# Patient Record
Sex: Male | Born: 1965 | Race: Black or African American | Hispanic: No | Marital: Single
Health system: Midwestern US, Community
[De-identification: ages and names within clinical notes are randomized; demographics above are authoritative.]

## PROBLEM LIST (undated history)

## (undated) DIAGNOSIS — E119 Type 2 diabetes mellitus without complications: Secondary | ICD-10-CM

## (undated) DIAGNOSIS — I509 Heart failure, unspecified: Secondary | ICD-10-CM

---

## 2015-05-13 ENCOUNTER — Inpatient Hospital Stay
Admit: 2015-05-13 | Discharge: 2015-05-13 | Disposition: A | Discharge status: Home / Self Care | Payer: BLUE CROSS/BLUE SHIELD | Attending: Emergency Medicine

## 2015-05-13 ENCOUNTER — Emergency Department
Admit: 2015-05-13 | Payer: BLUE CROSS/BLUE SHIELD | Primary: Student in an Organized Health Care Education/Training Program

## 2015-05-13 DIAGNOSIS — N200 Calculus of kidney: Secondary | ICD-10-CM

## 2015-05-13 LAB — CBC WITH AUTOMATED DIFF
ABS. BASOPHILS: 0 10*3/uL (ref 0.0–0.1)
ABS. EOSINOPHILS: 0.1 10*3/uL (ref 0.0–0.2)
ABS. LYMPHOCYTES: 1.9 10*3/uL (ref 1.0–5.5)
ABS. MONOCYTES: 0.6 10*3/uL (ref 0.1–1.0)
ABS. NEUTROPHILS: 4.4 10*3/uL (ref 2.0–8.1)
BASOPHILS: 0 % (ref 0.0–1.0)
EOSINOPHILS: 1 % (ref 0.0–2.0)
HCT: 45.3 % (ref 42.0–52.0)
HGB: 14.8 g/dL (ref 14.0–18.0)
LYMPHOCYTES: 27 % (ref 20.5–51.1)
MCH: 26.8 PG — ABNORMAL LOW (ref 27.0–31.0)
MCHC: 32.7 g/dL (ref 30.5–36.0)
MCV: 82.1 FL (ref 80.0–96.0)
MONOCYTES: 8 % (ref 1.7–10.0)
MPV: 10.9 FL (ref 10.2–12.7)
NEUTROPHILS: 64 % (ref 42.2–75.2)
PLATELET: 227 10*3/uL (ref 122–400)
RBC: 5.52 M/uL (ref 4.70–6.10)
RDW: 15.3 % — ABNORMAL HIGH (ref 11.4–14.6)
WBC: 7 10*3/uL (ref 4.8–10.8)

## 2015-05-13 LAB — URINALYSIS W/ RFLX MICROSCOPIC
Bilirubin: NEGATIVE
Glucose: NEGATIVE mg/dL
Ketone: 5 mg/dL — AB
Nitrites: POSITIVE — AB
Protein: 300 mg/dL — AB
Specific gravity: 1.01 (ref 1.005–1.030)
Urobilinogen: 1 EU/dL (ref 0.1–1.0)
pH (UA): 5 (ref 4.5–8.0)

## 2015-05-13 LAB — METABOLIC PANEL, COMPREHENSIVE
A-G Ratio: 1 (ref 1.0–1.5)
ALT (SGPT): 53 U/L (ref 12–78)
AST (SGOT): 35 U/L (ref 15–37)
Albumin: 4.3 g/dL (ref 3.4–5.0)
Alk. phosphatase: 50 U/L (ref 46–116)
Anion gap: 9 mmol/L (ref 8–20)
BUN: 23 mg/dL — ABNORMAL HIGH (ref 7–18)
Bilirubin, total: 0.5 mg/dL (ref 0.2–1.0)
CO2: 30 mmol/L (ref 21–32)
Calcium: 9.3 mg/dL (ref 8.5–10.1)
Chloride: 100 mmol/L (ref 98–107)
Creatinine: 2.1 mg/dL — ABNORMAL HIGH (ref 0.70–1.30)
GFR est AA: 43 mL/min/{1.73_m2} — ABNORMAL LOW (ref >60)
GFR est non-AA: 35 mL/min/{1.73_m2} — ABNORMAL LOW (ref >60)
Globulin: 4.1 g/dL (ref 2.5–5.0)
Glucose: 133 mg/dL — ABNORMAL HIGH (ref 74–106)
Potassium: 4.2 mmol/L (ref 3.5–5.1)
Protein, total: 8.4 g/dL — ABNORMAL HIGH (ref 6.4–8.2)
Sodium: 138 mmol/L (ref 136–145)

## 2015-05-13 LAB — URINE MICROSCOPIC

## 2015-05-13 LAB — LIPASE: Lipase: 883 U/L — CR (ref 73–393)

## 2015-05-13 LAB — PROTEIN, CONFIRM

## 2015-05-13 LAB — LACTIC ACID: Lactic acid: 0.8 MMOL/L (ref 0.4–2.0)

## 2015-05-13 MED ORDER — MORPHINE 4 MG/ML SYRINGE
4 mg/mL | INTRAMUSCULAR | Status: DC | PRN
Start: 2015-05-13 — End: 2015-05-13
  Administered 2015-05-13: 14:00:00 via INTRAVENOUS

## 2015-05-13 MED ORDER — SODIUM CHLORIDE 0.9 % IV
INTRAVENOUS | Status: DC
Start: 2015-05-13 — End: 2015-05-13

## 2015-05-13 MED ORDER — SODIUM CHLORIDE 0.9% BOLUS IV
0.9 % | Freq: Once | INTRAVENOUS | Status: AC
Start: 2015-05-13 — End: 2015-05-13
  Administered 2015-05-13: 14:00:00 via INTRAVENOUS

## 2015-05-13 MED ORDER — KETOROLAC TROMETHAMINE 10 MG TAB
10 mg | ORAL_TABLET | Freq: Four times a day (QID) | ORAL | 0 refills | Status: AC | PRN
Start: 2015-05-13 — End: ?

## 2015-05-13 MED ORDER — MORPHINE 4 MG/ML SYRINGE
4 mg/mL | Freq: Once | INTRAMUSCULAR | Status: DC
Start: 2015-05-13 — End: 2015-05-13

## 2015-05-13 MED ORDER — NITROFURANTOIN (25% MACROCRYSTAL FORM) 100 MG CAP
100 mg | ORAL_CAPSULE | Freq: Two times a day (BID) | ORAL | 0 refills | Status: AC
Start: 2015-05-13 — End: 2015-05-20

## 2015-05-13 MED ORDER — METFORMIN 1,000 MG TAB
1000 mg | ORAL_TABLET | Freq: Two times a day (BID) | ORAL | 0 refills | Status: AC
Start: 2015-05-13 — End: ?

## 2015-05-13 MED ORDER — HYDROCODONE-ACETAMINOPHEN 5 MG-325 MG TAB
5-325 mg | ORAL_TABLET | Freq: Four times a day (QID) | ORAL | 0 refills | Status: AC | PRN
Start: 2015-05-13 — End: ?

## 2015-05-13 MED ORDER — ONDANSETRON (PF) 4 MG/2 ML INJECTION
4 mg/2 mL | Freq: Once | INTRAMUSCULAR | Status: DC
Start: 2015-05-13 — End: 2015-05-13

## 2015-05-13 MED ORDER — ONDANSETRON (PF) 4 MG/2 ML INJECTION
4 mg/2 mL | INTRAMUSCULAR | Status: AC
Start: 2015-05-13 — End: 2015-05-13
  Administered 2015-05-13: 14:00:00 via INTRAVENOUS

## 2015-05-13 MED ORDER — TAMSULOSIN SR 0.4 MG 24 HR CAP
0.4 mg | ORAL | Status: AC
Start: 2015-05-13 — End: 2015-05-13
  Administered 2015-05-13: 14:00:00 via ORAL

## 2015-05-13 MED ORDER — KETOROLAC TROMETHAMINE 15 MG/ML INJECTION
15 mg/mL | INTRAMUSCULAR | Status: AC
Start: 2015-05-13 — End: 2015-05-13
  Administered 2015-05-13: 14:00:00 via INTRAVENOUS

## 2015-05-13 MED FILL — TAMSULOSIN SR 0.4 MG 24 HR CAP: 0.4 mg | ORAL | Qty: 1

## 2015-05-13 MED FILL — SODIUM CHLORIDE 0.9 % IV: INTRAVENOUS | Qty: 3000

## 2015-05-13 MED FILL — MORPHINE 4 MG/ML SYRINGE: 4 mg/mL | INTRAMUSCULAR | Qty: 1

## 2015-05-13 MED FILL — ONDANSETRON (PF) 4 MG/2 ML INJECTION: 4 mg/2 mL | INTRAMUSCULAR | Qty: 2

## 2015-05-13 MED FILL — KETOROLAC TROMETHAMINE 15 MG/ML INJECTION: 15 mg/mL | INTRAMUSCULAR | Qty: 1

## 2015-05-13 MED FILL — SODIUM CHLORIDE 0.9 % IV: INTRAVENOUS | Qty: 1000

## 2015-05-13 NOTE — ED Provider Notes (Addendum)
HPI Comments: This pt is bib self and his mother (who is visiting from [place]) and co bloody urine and pain lower abdomen and right side similar to stone that he has had in past. Pt relates he has had cycstoscopy in the past by urologist.        Pt only other med problem per pt is diabetes and some allergies.    [place]     No cp no sob no vomiting     Patient is a 50 y.o. male presenting with flank pain and abdominal pain. The history is provided by the patient.   Flank Pain    This is a new problem. The current episode started 12 to 24 hours ago. The problem has been gradually worsening. The problem occurs constantly. Patient reports not work related injury.The quality of the pain is described as shooting, stabbing and cramping. The pain radiates to the right groin. The pain is moderate. Associated symptoms include abdominal pain and dysuria. Pertinent negatives include no chest pain, no fever and no headaches. He has tried nothing for the symptoms. Risk factors include history of kidney stones.   Abdominal Pain    This is a new problem. The current episode started 12 to 24 hours ago. The problem has been gradually worsening. The pain is mild. Associated symptoms include dysuria, frequency and hematuria. Pertinent negatives include no fever, no diarrhea, no nausea, no vomiting, no constipation, no headaches, no arthralgias, no myalgias, no chest pain and no back pain.        Past Medical History:   Diagnosis Date   ??? Diabetes (HCC)    ??? H/O seasonal allergies        History reviewed. No pertinent surgical history.      History reviewed. No pertinent family history.    Social History     Social History   ??? Marital status: SINGLE     Spouse name: N/A   ??? Number of children: N/A   ??? Years of education: N/A     Occupational History   ??? Not on file.     Social History Main Topics   ??? Smoking status: Never Smoker   ??? Smokeless tobacco: Not on file   ??? Alcohol use No   ??? Drug use: Not on file    ??? Sexual activity: Not on file     Other Topics Concern   ??? Not on file     Social History Narrative   ??? No narrative on file         ALLERGIES: Review of patient's allergies indicates no known allergies.    Review of Systems   Constitutional: Negative for chills and fever.   HENT: Negative for congestion and rhinorrhea.    Eyes: Negative for pain and visual disturbance.   Respiratory: Negative for cough and shortness of breath.    Cardiovascular: Negative for chest pain and palpitations.   Gastrointestinal: Positive for abdominal pain. Negative for blood in stool, constipation, diarrhea, nausea and vomiting.   Genitourinary: Positive for dysuria, flank pain, frequency and hematuria. Negative for difficulty urinating.   Musculoskeletal: Negative for arthralgias, back pain and myalgias.   Skin: Negative for color change and rash.   Neurological: Negative for seizures and headaches.   Psychiatric/Behavioral: Negative for dysphoric mood and self-injury.       Vitals:    05/13/15 0838 05/13/15 0840   BP:  (!) 148/111   Pulse:  96   Resp:  16   Temp:  97.7 ??F (36.5 ??C)   SpO2:  98%   Weight: 97.5 kg (215 lb)    Height:  (1.88 m)             Physical Exam   Constitutional: He is oriented to person, place, and time. He appears well-developed and well-nourished. No distress.   HENT:   Head: Normocephalic and atraumatic.   Eyes: Conjunctivae are normal. Pupils are equal, round, and reactive to light. No scleral icterus.   Neck: Normal range of motion. Neck supple. No tracheal deviation present.   Cardiovascular: Normal rate and regular rhythm.    No murmur heard.  Pulmonary/Chest: Effort normal and breath sounds normal. No respiratory distress. He has no wheezes. He has no rales.   Abdominal: Soft. Bowel sounds are normal. He exhibits no distension. There is tenderness (sl tenderness rlq , no guarding no rebound ). There is no rebound.   Musculoskeletal: Normal range of motion. He exhibits no edema or  tenderness (no cva tenderness ).   Neurological: He is alert and oriented to person, place, and time.   Skin: Skin is warm and dry. No rash noted. He is not diaphoretic.   Psychiatric: He has a normal mood and affect. His behavior is normal.   Nursing note and vitals reviewed.       MDM  ED Course       Procedures    <EMERGENCY DEPARTMENT CASE SUMMARY>    Impression/Differential Diagnosis: rlq pain and hematuria in pt with diabetes on oral agent   ddx most likely uvjxn stone right side, other possibitites cystitis, pyelo , appy less likely , bph, prostatis among others     Plan: Based on my initial history, physical examination, this patient's past history and risk factors, we will obtain diagnostics and based on these and response to therapy will make a decision with best judgement to admit, transfer or discharge this patient with close follow up.  My initial plan is likely to control pain and hydrate and dc with urology fu after labs and ct imaging. Pt may need cystoscopy    ED Course:   9:21 AM  Pt to ct, aawait data    Final Impression/Diagnosis: hematuria, right ureteral colic    Patient condition at time of disposition: stable       I have reviewed the following home medications:    Prior to Admission medications    Medication Sig Start Date End Date Taking? Authorizing Provider   SITagliptin-metFORMIN (JANUMET) 50-1,000 mg per tablet Take 1 Tab by mouth two (2) times daily (with meals).   Yes Phys Other, MD   Cetirizine (ZYRTEC) 10 mg cap Take  by mouth.   Yes Phys Other, MD   fluticasone (FLONASE) 50 mcg/actuation nasal spray 2 Sprays by Both Nostrils route daily.   Yes Phys Other, MD         Arvella Nigh, MD

## 2015-05-13 NOTE — ED Notes (Signed)
Ct to me suggests 4.775mm stone uvjn  Will give flomax , wbc nl await pain relief and all data

## 2015-05-13 NOTE — ED Triage Notes (Signed)
Pt to ed with c/o stomach discomfort since last night and had blood in urine   Pain more rt flank  Had hx of stones

## 2015-05-13 NOTE — ED Notes (Signed)
pcp is dr Johny Chessadubor at [facility]   Pt and mother updated

## 2015-05-13 NOTE — ED Notes (Signed)
Pt disch amb with info sheets and rx electronically sent  instr to strain all urine  Strainer given   Patient is awake, alert, and oriented, speech is clear and patient is able to ambulate (if applicable) and ready for discharge. Verbal and written discharge instructions provided and has the cognitive understanding of discharge instructions. Discharged home with family. All questions answered.

## 2015-05-13 NOTE — ED Notes (Signed)
Called dr Elayne Snarellado, left side per him,   Pt informed small stone

## 2015-05-14 LAB — CULTURE, URINE
Culture result:: NO GROWTH
Culture: NO GROWTH

## 2015-05-18 LAB — CULTURE, BLOOD
Culture result:: NO GROWTH
Culture result:: NO GROWTH

## 2016-01-12 HISTORY — PX: CORONARY ANGIOPLASTY WITH STENT PLACEMENT: SHX49

## 2017-01-02 ENCOUNTER — Inpatient Hospital Stay
Admit: 2017-01-02 | Discharge: 2017-01-02 | Disposition: A | Discharge status: Home / Self Care | Payer: BLUE CROSS/BLUE SHIELD | Attending: Emergency Medical Services

## 2017-01-02 ENCOUNTER — Emergency Department
Admit: 2017-01-02 | Payer: BLUE CROSS/BLUE SHIELD | Primary: Student in an Organized Health Care Education/Training Program

## 2017-01-02 DIAGNOSIS — M7741 Metatarsalgia, right foot: Secondary | ICD-10-CM

## 2017-01-02 MED ORDER — KETOROLAC TROMETHAMINE 30 MG/ML INJECTION
30 mg/mL (1 mL) | INTRAMUSCULAR | Status: AC
Start: 2017-01-02 — End: 2017-01-02
  Administered 2017-01-02: 15:00:00 via INTRAMUSCULAR

## 2017-01-02 MED FILL — KETOROLAC TROMETHAMINE 30 MG/ML INJECTION: 30 mg/mL (1 mL) | INTRAMUSCULAR | Qty: 1

## 2017-01-02 NOTE — ED Triage Notes (Signed)
Pt C/O right foot pain after having cardia cath on 12/30/16 pain is 6/10 at rest

## 2017-01-02 NOTE — ED Provider Notes (Signed)
51 yo male with several days of continuous moderately severe right foot pain unknown cause localized ball of right foot and 4th toe, no history of injury, no joint pain/swelling/redness.  No pallor, coolness, change of color.  Hx of heart cath 2 days ago with right groin without mass, mild appropriately tender.  Was given percocet while while admitted for heart cath, did not help foot pain.  Was told not related to heart cath.  No history of gout, RA.          Foot Pain    This is a new problem. The current episode started more than 2 days ago. The problem occurs daily. The problem has not changed since onset.The pain is present in the left foot. The quality of the pain is described as aching. The pain is moderate. Pertinent negatives include full range of motion, no stiffness and no tingling. The symptoms are aggravated by palpation and standing. He has tried OTC pain medications (opioid analgesics) for the symptoms. The treatment provided no relief. There has been no history of extremity trauma.        Past Medical History:   Diagnosis Date   ??? CAD (coronary artery disease)    ??? Diabetes (HCC)    ??? H/O seasonal allergies        Past Surgical History:   Procedure Laterality Date   ??? HX APPENDECTOMY     ??? HX HEART CATHETERIZATION           History reviewed. No pertinent family history.    Social History     Socioeconomic History   ??? Marital status: SINGLE     Spouse name: Not on file   ??? Number of children: Not on file   ??? Years of education: Not on file   ??? Highest education level: Not on file   Social Needs   ??? Financial resource strain: Not on file   ??? Food insecurity - worry: Not on file   ??? Food insecurity - inability: Not on file   ??? Transportation needs - medical: Not on file   ??? Transportation needs - non-medical: Not on file   Occupational History   ??? Not on file   Tobacco Use   ??? Smoking status: Never Smoker   ??? Smokeless tobacco: Never Used   Substance and Sexual Activity   ??? Alcohol use: No    ??? Drug use: No   ??? Sexual activity: Not on file   Other Topics Concern   ??? Not on file   Social History Narrative   ??? Not on file         ALLERGIES: Shellfish derived    Review of Systems   Musculoskeletal: Negative for joint swelling and stiffness.   Skin: Negative for color change, pallor, rash and wound.   Neurological: Negative for tingling.   All other systems reviewed and are negative.      Vitals:    01/02/17 0950   BP: 121/77   Pulse: 88   Resp: 18   Temp: 98.5 ??F (36.9 ??C)   SpO2: 100%   Weight: 99.3 kg (219 lb)   Height: 6\' 2"  (1.88 m)            Physical Exam   Constitutional: He is oriented to person, place, and time. He appears well-developed and well-nourished. No distress.   HENT:   Head: Normocephalic and atraumatic.   Eyes: Conjunctivae and EOM are normal. Pupils are equal, round, and reactive  to light.   Cardiovascular: Intact distal pulses and normal pulses. Exam reveals no decreased pulses.   Pulses:       Dorsalis pedis pulses are 2+ on the right side, and 2+ on the left side.   Pulmonary/Chest: Effort normal and breath sounds normal.   Abdominal: Soft. He exhibits no distension. There is no tenderness.       Musculoskeletal: Normal range of motion. He exhibits tenderness. He exhibits no edema or deformity.        Right hip: He exhibits no tenderness and no swelling.        Right knee: Normal. He exhibits normal range of motion, no swelling, no effusion, no ecchymosis, no deformity, no laceration, no erythema, normal alignment, no LCL laxity and no bony tenderness. No tenderness found. No medial joint line and no lateral joint line tenderness noted.        Right ankle: Normal. He exhibits normal range of motion, no swelling, no ecchymosis, no deformity, no laceration and normal pulse. No tenderness. No lateral malleolus, no medial malleolus, no posterior TFL, no head of 5th metatarsal and no proximal fibula tenderness found.        Right lower leg: Normal. He exhibits no tenderness, no bony  tenderness, no swelling, no edema, no deformity and no laceration.        Right foot: There is tenderness and bony tenderness. There is normal range of motion, no swelling, normal capillary refill, no crepitus, no deformity and no laceration.        Feet:    Neurological: He is alert and oriented to person, place, and time. No sensory deficit. He exhibits normal muscle tone.   Skin: Skin is warm, dry and intact. Capillary refill takes less than 2 seconds. No abrasion, no bruising, no burn, no ecchymosis, no lesion, no petechiae and no rash noted. He is not diaphoretic. No cyanosis or erythema. No pallor.   Psychiatric: He has a normal mood and affect. His behavior is normal. Thought content normal.   Nursing note and vitals reviewed.       MDM  Number of Diagnoses or Management Options  Metatarsalgia of right foot:   Diagnosis management comments: 51 yo male with several days of continuous moderately severe right foot pain unknown cause localized ball of right foot and 4th toe, no history of injury, no joint pain/swelling/redness.  No pallor, coolness, change of color.  Hx of heart cath 2 days ago with right groin without mass, mild appropriately tender.  Was given percocet while while admitted for heart cath, did not help foot pain.  Was told not related to heart cath.  No history of gout, RA.    Exam with normal appearing right foot, warm, readily palpable dorsalis pedis, skin intact, no erythema/rash, no joint tenderness/warmth.  Tenderness head of metatarsals, no point tenderness dorsum of foot      ED Course:    Xrays right foot  Toradol IM  Podiatry referral          Amount and/or Complexity of Data Reviewed  Tests in the radiology section of CPT??: ordered and reviewed  Independent visualization of images, tracings, or specimens: yes (Independent interpretation of right foot xrays with no acute abnl)    Risk of Complications, Morbidity, and/or Mortality  Presenting problems: high   Diagnostic procedures: moderate  Management options: moderate  General comments: 1.  New, needs workup  2.  xrays ordered/reviewed  Independent interp of xrays  3.  Referral  to orthopedics    Patient Progress  Patient progress: stable         Procedures

## 2017-01-02 NOTE — ED Notes (Signed)
Pt C/O right foot pain after cardiac cath on 12/30/16 pt denies any other symptoms

## 2017-01-02 NOTE — ED Notes (Signed)
Pt to be discharged home A & O X 3 ambulating with a steady gait. D/C instructions reviewed pt verbalizes understanding

## 2019-01-02 ENCOUNTER — Inpatient Hospital Stay (HOSPITAL_BASED_OUTPATIENT_CLINIC_OR_DEPARTMENT_OTHER)
Admission: EM | Admit: 2019-01-02 | Discharge: 2019-01-06 | DRG: 871 | Disposition: A | Discharge status: Home / Self Care | Payer: BLUE CROSS/BLUE SHIELD | Attending: Internal Medicine | Admitting: Internal Medicine

## 2019-01-02 ENCOUNTER — Other Ambulatory Visit: Payer: Self-pay

## 2019-01-02 ENCOUNTER — Emergency Department (HOSPITAL_BASED_OUTPATIENT_CLINIC_OR_DEPARTMENT_OTHER): Payer: BLUE CROSS/BLUE SHIELD

## 2019-01-02 ENCOUNTER — Encounter (HOSPITAL_BASED_OUTPATIENT_CLINIC_OR_DEPARTMENT_OTHER): Payer: Self-pay | Admitting: *Deleted

## 2019-01-02 ENCOUNTER — Inpatient Hospital Stay (HOSPITAL_COMMUNITY): Payer: BLUE CROSS/BLUE SHIELD

## 2019-01-02 DIAGNOSIS — R0602 Shortness of breath: Secondary | ICD-10-CM

## 2019-01-02 DIAGNOSIS — N179 Acute kidney failure, unspecified: Secondary | ICD-10-CM | POA: Diagnosis present

## 2019-01-02 DIAGNOSIS — J9601 Acute respiratory failure with hypoxia: Secondary | ICD-10-CM | POA: Diagnosis present

## 2019-01-02 DIAGNOSIS — E1122 Type 2 diabetes mellitus with diabetic chronic kidney disease: Secondary | ICD-10-CM | POA: Diagnosis present

## 2019-01-02 DIAGNOSIS — Z8249 Family history of ischemic heart disease and other diseases of the circulatory system: Secondary | ICD-10-CM

## 2019-01-02 DIAGNOSIS — I5042 Chronic combined systolic (congestive) and diastolic (congestive) heart failure: Secondary | ICD-10-CM | POA: Diagnosis present

## 2019-01-02 DIAGNOSIS — Z7982 Long term (current) use of aspirin: Secondary | ICD-10-CM | POA: Diagnosis not present

## 2019-01-02 DIAGNOSIS — I959 Hypotension, unspecified: Secondary | ICD-10-CM | POA: Diagnosis present

## 2019-01-02 DIAGNOSIS — J1289 Other viral pneumonia: Secondary | ICD-10-CM | POA: Diagnosis present

## 2019-01-02 DIAGNOSIS — Z7902 Long term (current) use of antithrombotics/antiplatelets: Secondary | ICD-10-CM | POA: Diagnosis not present

## 2019-01-02 DIAGNOSIS — A4189 Other specified sepsis: Principal | ICD-10-CM | POA: Diagnosis present

## 2019-01-02 DIAGNOSIS — U071 COVID-19: Secondary | ICD-10-CM | POA: Diagnosis present

## 2019-01-02 DIAGNOSIS — Z7984 Long term (current) use of oral hypoglycemic drugs: Secondary | ICD-10-CM

## 2019-01-02 DIAGNOSIS — I2510 Atherosclerotic heart disease of native coronary artery without angina pectoris: Secondary | ICD-10-CM | POA: Diagnosis present

## 2019-01-02 DIAGNOSIS — Z79899 Other long term (current) drug therapy: Secondary | ICD-10-CM | POA: Diagnosis not present

## 2019-01-02 DIAGNOSIS — N189 Chronic kidney disease, unspecified: Secondary | ICD-10-CM | POA: Diagnosis present

## 2019-01-02 DIAGNOSIS — E0800 Diabetes mellitus due to underlying condition with hyperosmolarity without nonketotic hyperglycemic-hyperosmolar coma (NKHHC): Secondary | ICD-10-CM

## 2019-01-02 DIAGNOSIS — Z955 Presence of coronary angioplasty implant and graft: Secondary | ICD-10-CM | POA: Diagnosis not present

## 2019-01-02 DIAGNOSIS — R000 Tachycardia, unspecified: Secondary | ICD-10-CM

## 2019-01-02 DIAGNOSIS — R7989 Other specified abnormal findings of blood chemistry: Secondary | ICD-10-CM

## 2019-01-02 DIAGNOSIS — I5022 Chronic systolic (congestive) heart failure: Secondary | ICD-10-CM | POA: Diagnosis present

## 2019-01-02 DIAGNOSIS — E119 Type 2 diabetes mellitus without complications: Secondary | ICD-10-CM

## 2019-01-02 HISTORY — DX: Type 2 diabetes mellitus without complications: E11.9

## 2019-01-02 HISTORY — DX: Heart failure, unspecified: I50.9

## 2019-01-02 LAB — CBG MONITORING, ED: Glucose-Capillary: 103 mg/dL — ABNORMAL HIGH (ref 70–99)

## 2019-01-02 LAB — URINALYSIS, MICROSCOPIC (REFLEX)

## 2019-01-02 LAB — SARS CORONAVIRUS 2 (TAT 6-24 HRS): SARS Coronavirus 2: POSITIVE — AB

## 2019-01-02 LAB — CBC WITH DIFFERENTIAL/PLATELET
Abs Immature Granulocytes: 0.06 10*3/uL (ref 0.00–0.07)
Basophils Absolute: 0 10*3/uL (ref 0.0–0.1)
Basophils Relative: 0 %
Eosinophils Absolute: 0 10*3/uL (ref 0.0–0.5)
Eosinophils Relative: 0 %
HCT: 49.1 % (ref 39.0–52.0)
Hemoglobin: 15.9 g/dL (ref 13.0–17.0)
Immature Granulocytes: 1 %
Lymphocytes Relative: 14 %
Lymphs Abs: 1.4 10*3/uL (ref 0.7–4.0)
MCH: 28.2 pg (ref 26.0–34.0)
MCHC: 32.4 g/dL (ref 30.0–36.0)
MCV: 87.1 fL (ref 80.0–100.0)
Monocytes Absolute: 0.6 10*3/uL (ref 0.1–1.0)
Monocytes Relative: 6 %
Neutro Abs: 7.7 10*3/uL (ref 1.7–7.7)
Neutrophils Relative %: 79 %
Platelets: 241 10*3/uL (ref 150–400)
RBC: 5.64 MIL/uL (ref 4.22–5.81)
RDW: 14.9 % (ref 11.5–15.5)
WBC: 9.8 10*3/uL (ref 4.0–10.5)
nRBC: 0 % (ref 0.0–0.2)

## 2019-01-02 LAB — URINALYSIS, ROUTINE W REFLEX MICROSCOPIC
Bilirubin Urine: NEGATIVE
Glucose, UA: >=500 mg/dL — AB
Ketones, ur: NEGATIVE mg/dL
Leukocytes,Ua: NEGATIVE
Nitrite: NEGATIVE
Protein, ur: NEGATIVE mg/dL
Specific Gravity, Urine: 1.02 (ref 1.005–1.030)
pH: 6 (ref 5.0–8.0)

## 2019-01-02 LAB — D-DIMER, QUANTITATIVE: D-Dimer, Quant: 0.75 ug/mL-FEU — ABNORMAL HIGH (ref 0.00–0.50)

## 2019-01-02 LAB — PROCALCITONIN: Procalcitonin: <0.1 ng/mL

## 2019-01-02 LAB — TROPONIN I (HIGH SENSITIVITY): Troponin I (High Sensitivity): 8 ng/L (ref <18)

## 2019-01-02 LAB — LACTIC ACID, PLASMA: Lactic Acid, Venous: 1.7 mmol/L (ref 0.5–1.9)

## 2019-01-02 LAB — COMPREHENSIVE METABOLIC PANEL
ALT: 26 U/L (ref 0–44)
AST: 23 U/L (ref 15–41)
Albumin: 4.1 g/dL (ref 3.5–5.0)
Alkaline Phosphatase: 58 U/L (ref 38–126)
Anion gap: 12 (ref 5–15)
BUN: 40 mg/dL — ABNORMAL HIGH (ref 6–20)
CO2: 27 mmol/L (ref 22–32)
Calcium: 9.4 mg/dL (ref 8.9–10.3)
Chloride: 97 mmol/L — ABNORMAL LOW (ref 98–111)
Creatinine, Ser: 2.28 mg/dL — ABNORMAL HIGH (ref 0.61–1.24)
GFR calc Af Amer: 36 mL/min — ABNORMAL LOW (ref >60)
GFR calc non Af Amer: 31 mL/min — ABNORMAL LOW (ref >60)
Glucose, Bld: 103 mg/dL — ABNORMAL HIGH (ref 70–99)
Potassium: 3.7 mmol/L (ref 3.5–5.1)
Sodium: 136 mmol/L (ref 135–145)
Total Bilirubin: 0.8 mg/dL (ref 0.3–1.2)
Total Protein: 8.2 g/dL — ABNORMAL HIGH (ref 6.5–8.1)

## 2019-01-02 LAB — LACTATE DEHYDROGENASE: LDH: 144 U/L (ref 98–192)

## 2019-01-02 LAB — TRIGLYCERIDES: Triglycerides: 308 mg/dL — ABNORMAL HIGH (ref <150)

## 2019-01-02 LAB — GLUCOSE, CAPILLARY: Glucose-Capillary: 68 mg/dL — ABNORMAL LOW (ref 70–99)

## 2019-01-02 LAB — BRAIN NATRIURETIC PEPTIDE: B Natriuretic Peptide: 18.6 pg/mL (ref 0.0–100.0)

## 2019-01-02 LAB — FERRITIN: Ferritin: 281 ng/mL (ref 24–336)

## 2019-01-02 LAB — FIBRINOGEN: Fibrinogen: 689 mg/dL — ABNORMAL HIGH (ref 210–475)

## 2019-01-02 LAB — C-REACTIVE PROTEIN: CRP: 3.7 mg/dL — ABNORMAL HIGH (ref <1.0)

## 2019-01-02 LAB — SARS CORONAVIRUS 2 AG (30 MIN TAT): SARS Coronavirus 2 Ag: POSITIVE — AB

## 2019-01-02 MED ORDER — METOPROLOL TARTRATE 25 MG PO TABS
25.0000 mg | ORAL_TABLET | Freq: Two times a day (BID) | ORAL | Status: DC
  Administered 2019-01-03 – 2019-01-06 (×8): 25 mg via ORAL
  Filled 2019-01-02 (×8): qty 1

## 2019-01-02 MED ORDER — INSULIN ASPART 100 UNIT/ML ~~LOC~~ SOLN
0.0000 [IU] | Freq: Three times a day (TID) | SUBCUTANEOUS | Status: DC
  Administered 2019-01-03: 2 [IU] via SUBCUTANEOUS
  Administered 2019-01-03 – 2019-01-05 (×5): 1 [IU] via SUBCUTANEOUS
  Administered 2019-01-06 (×2): 2 [IU] via SUBCUTANEOUS

## 2019-01-02 MED ORDER — ENOXAPARIN SODIUM 40 MG/0.4ML ~~LOC~~ SOLN
40.0000 mg | Freq: Every day | SUBCUTANEOUS | Status: DC
  Administered 2019-01-02 – 2019-01-05 (×4): 40 mg via SUBCUTANEOUS
  Filled 2019-01-02 (×4): qty 0.4

## 2019-01-02 MED ORDER — INSULIN ASPART 100 UNIT/ML ~~LOC~~ SOLN: 0.0000 [IU] | Freq: Every day | SUBCUTANEOUS | Status: DC

## 2019-01-02 MED ORDER — ACETAMINOPHEN 500 MG PO TABS
1000.0000 mg | ORAL_TABLET | Freq: Once | ORAL | Status: AC
  Administered 2019-01-02: 13:00:00 1000 mg via ORAL
  Filled 2019-01-02: qty 2

## 2019-01-02 MED ORDER — ACETAMINOPHEN 325 MG PO TABS
650.0000 mg | ORAL_TABLET | Freq: Four times a day (QID) | ORAL | Status: DC | PRN
  Administered 2019-01-02: 650 mg via ORAL
  Filled 2019-01-02: qty 2

## 2019-01-02 MED ORDER — DEXAMETHASONE SODIUM PHOSPHATE 10 MG/ML IJ SOLN
6.0000 mg | Freq: Every day | INTRAMUSCULAR | Status: DC
  Administered 2019-01-02 – 2019-01-05 (×4): 6 mg via INTRAVENOUS
  Filled 2019-01-02 (×4): qty 1

## 2019-01-02 MED ORDER — METFORMIN HCL 500 MG PO TABS
500.0000 mg | ORAL_TABLET | Freq: Two times a day (BID) | ORAL | Status: DC
  Administered 2019-01-02: 500 mg via ORAL
  Filled 2019-01-02: qty 1

## 2019-01-02 MED ORDER — ASPIRIN EC 81 MG PO TBEC
81.0000 mg | DELAYED_RELEASE_TABLET | Freq: Every day | ORAL | Status: DC
  Administered 2019-01-03 – 2019-01-06 (×4): 81 mg via ORAL
  Filled 2019-01-02 (×4): qty 1

## 2019-01-02 MED ORDER — SODIUM CHLORIDE 0.9 % IV SOLN
100.0000 mg | Freq: Every day | INTRAVENOUS | Status: AC
  Administered 2019-01-03 – 2019-01-06 (×4): 100 mg via INTRAVENOUS
  Filled 2019-01-02 (×4): qty 100

## 2019-01-02 MED ORDER — SODIUM CHLORIDE 0.9 % IV SOLN
Freq: Once | INTRAVENOUS | Status: AC
  Administered 2019-01-02: 1000 mL via INTRAVENOUS

## 2019-01-02 MED ORDER — SODIUM CHLORIDE 0.9 % IV BOLUS
500.0000 mL | Freq: Once | INTRAVENOUS | Status: AC
  Administered 2019-01-02: 500 mL via INTRAVENOUS

## 2019-01-02 MED ORDER — SODIUM CHLORIDE 0.9 % IV SOLN: INTRAVENOUS | Status: DC

## 2019-01-02 MED ORDER — CLOPIDOGREL BISULFATE 75 MG PO TABS
75.0000 mg | ORAL_TABLET | Freq: Every day | ORAL | Status: DC
  Administered 2019-01-02 – 2019-01-06 (×5): 75 mg via ORAL
  Filled 2019-01-02 (×5): qty 1

## 2019-01-02 MED ORDER — SODIUM CHLORIDE 0.9 % IV SOLN
200.0000 mg | Freq: Once | INTRAVENOUS | Status: AC
  Administered 2019-01-02: 200 mg via INTRAVENOUS
  Filled 2019-01-02: qty 200

## 2019-01-02 MED ORDER — DAPAGLIFLOZIN PROPANEDIOL 5 MG PO TABS: 5.0000 mg | ORAL_TABLET | Freq: Every day | ORAL | Status: DC

## 2019-01-02 NOTE — H&P (Addendum)
TRH H&P    Patient Demographics:    Oscar Mack, is a 53 y.o. male  MRN: [id number]  DOB - January 09, 1966  Admit Date - 01/02/2019  Referring MD/NP/PA:  Arlean Hopping  Outpatient Primary MD for the patient is Patient, No Pcp Per  Patient coming from:  Home to [place]  Chief complaint- dyspnea   HPI:    Oscar Mack  is a 53 y.o. male, visiting from [place], w Dm2, CAD s/p stent x2, CHF (systolic), apparently [id number] positive on 12/25/2018 ? presents with dyspnea , tachycardia, and nasal congestion, cough, diarrhea, intermittent headache and fever 102 and myalgia.    In ED,  T 99.5, P 121, R 16, Bp 115/86  Pox 98% on RA Wt 97.9kg  CXR IMPRESSION: No active disease.  Na 136, K 3.7, Bun 40, Creatinine 2.28 Ast 23, Alt 26 Wbc 9.8, Hgb 15.9, Plt 241 Urinalysis negative Lactic acid 1.7 Procalcitonin <0.1 LDH 144 Ferritin 281 Tg 308 Fibrinogen 689 Crp 3.7 BNP 18.6  D dimer 0.75  Sars Coronavirus 2 positive   Blood culture x2 pending    Pt was given remdesivir, and ns 522mL iv x1 in ED,   Pt will be admitted for covid-19 and ? ARF.    Review of systems:    In addition to the HPI above,    No Headache, No changes with Vision or hearing, No problems swallowing food or Liquids, No Chest pain,  No Abdominal pain,    No Blood in stool or Urine, No dysuria, No new skin rashes or bruises, No new joints pains-aches,  No new weakness, tingling, numbness in any extremity, No recent weight gain or loss, No polyuria, polydypsia or polyphagia, No significant Mental Stressors.  All other systems reviewed and are negative.    Past History of the following :    Past Medical History:  Diagnosis Date  . Diabetes mellitus without complication ([place])   . Heart failure [facility])       Past Surgical History:  Procedure Laterality Date  . CORONARY ANGIOPLASTY WITH STENT PLACEMENT  2018      Social History:      Social History   Tobacco Use  . Smoking status: Never Smoker  . Smokeless tobacco: Never Used  Substance Use Topics  . Alcohol use: Never       Family History :     Family History  Problem Relation Age of Onset  . Heart attack Father        Home Medications:   Prior to Admission medications   Medication Sig Start Date End Date Taking? Authorizing Provider  aspirin EC 81 MG tablet Take 81 mg by mouth daily.   Yes [provider]  clopidogrel (PLAVIX) 75 MG tablet Take 75 mg by mouth daily.   Yes [provider]  Dapagliflozin Propanediol (FARXIGA PO) Take by mouth.   Yes [provider]  metFORMIN (GLUCOPHAGE) 500 MG tablet Take by mouth 2 (two) times daily with a meal.   Yes [provider]  metoprolol tartrate (LOPRESSOR) 25 MG tablet Take 25 mg by mouth 2 (two) times daily.   Yes [provider]  Sacubitril-Valsartan (ENTRESTO PO) Take by mouth 2 (two) times daily.   Yes [provider]  Semaglutide (OZEMPIC, 1 MG/DOSE, [place]) Inject 1 mg into the skin once a week.   Yes [provider]     Allergies:    No Known Allergies   Physical Exam:   Vitals  Blood pressure 105/72, pulse (!) 102, temperature 98.6 F (37 C), temperature source Oral, resp. rate 18, height  (1.88 m), weight 96.8 kg, SpO2 97 %.  1.  General: axoxo3  2. Psychiatric: euthymic  3. Neurologic: cn2-12 intact, reflexes 2+ symmetric, diffuse with no clonus, motor 5/5 in all 4 ext  4. HEENMT:  Anicteric, pupils 1.25mm symmetric, direct, consensual , near intact,  Neck: no jvd  5. Respiratory : Slight crackles bilateral base, no wheezing  6. Cardiovascular : rrr s1, s2, no m/g/r  7. Gastrointestinal:  Abd: soft, nt, nd, +bs  8. Skin:  Ext: no c/c/e, no rash  9.Musculoskeletal:  Good ROM    Data Review:    CBC Recent Labs  Lab 01/02/19 0957  WBC 9.8  HGB 15.9  HCT 49.1  PLT 241  MCV  87.1  MCH 28.2  MCHC 32.4  RDW 14.9  LYMPHSABS 1.4  MONOABS 0.6  EOSABS 0.0  BASOSABS 0.0   ------------------------------------------------------------------------------------------------------------------  Results for orders placed or performed during the hospital encounter of 01/02/19 (from the past 48 hour(s))  Comprehensive metabolic panel     Status: Abnormal   Collection Time: 01/02/19  9:57 AM  Result Value Ref Range   Sodium 136 135 - 145 mmol/L   Potassium 3.7 3.5 - 5.1 mmol/L   Chloride 97 (L) 98 - 111 mmol/L   CO2 27 22 - 32 mmol/L   Glucose, Bld 103 (H) 70 - 99 mg/dL   BUN 40 (H) 6 - 20 mg/dL   Creatinine, Ser 8.11 (H) 0.61 - 1.24 mg/dL   Calcium 9.4 8.9 - 91.4 mg/dL   Total Protein 8.2 (H) 6.5 - 8.1 g/dL   Albumin 4.1 3.5 - 5.0 g/dL   AST 23 15 - 41 U/L   ALT 26 0 - 44 U/L   Alkaline Phosphatase 58 38 - 126 U/L   Total Bilirubin 0.8 0.3 - 1.2 mg/dL   GFR calc non Af Amer 31 (L) >60 mL/min   GFR calc Af Amer 36 (L) >60 mL/min   Anion gap 12 5 - 15    Comment: Performed at [facility], [address] [place], [place] [address]  CBC with Differential     Status: None   Collection Time: 01/02/19  9:57 AM  Result Value Ref Range   WBC 9.8 4.0 - 10.5 K/uL   RBC 5.64 4.22 - 5.81 MIL/uL   Hemoglobin 15.9 13.0 - 17.0 g/dL   HCT 62.1 30.8 - 65.7 %   MCV 87.1 80.0 - 100.0 fL   MCH 28.2 26.0 - 34.0 pg   MCHC 32.4 30.0 - 36.0 g/dL   RDW 84.6 96.2 - 95.2 %   Platelets 241 150 - 400 K/uL   nRBC 0.0 0.0 - 0.2 %   Neutrophils Relative % 79 %   Neutro Abs 7.7 1.7 - 7.7 K/uL   Lymphocytes Relative 14 %   Lymphs Abs 1.4 0.7 - 4.0 K/uL   Monocytes Relative 6 %   Monocytes  Absolute 0.6 0.1 - 1.0 K/uL   Eosinophils Relative 0 %   Eosinophils Absolute 0.0 0.0 - 0.5 K/uL   Basophils Relative 0 %   Basophils Absolute 0.0 0.0 - 0.1 K/uL   Immature Granulocytes 1 %   Abs Immature Granulocytes 0.06 0.00 - 0.07 K/uL    Comment: Performed at [facility] [place], [address] [place], [place] [address]  Urinalysis, Routine w reflex microscopic     Status: Abnormal   Collection Time: 01/02/19  9:57 AM  Result Value Ref Range   Color, Urine YELLOW YELLOW   APPearance CLEAR CLEAR   Specific Gravity, Urine 1.020 1.005 - 1.030   pH 6.0 5.0 - 8.0   Glucose, UA >=500 (A) NEGATIVE mg/dL   Hgb urine dipstick TRACE (A) NEGATIVE   Bilirubin Urine NEGATIVE NEGATIVE   Ketones, ur NEGATIVE NEGATIVE mg/dL   Protein, ur NEGATIVE NEGATIVE mg/dL   Nitrite NEGATIVE NEGATIVE   Leukocytes,Ua NEGATIVE NEGATIVE    Comment: Performed at [facility] [place], [address] [place], [place] [address]  Urinalysis, Microscopic (reflex)     Status: Abnormal   Collection Time: 01/02/19  9:57 AM  Result Value Ref Range   RBC / HPF 6-10 0 - 5 RBC/hpf   WBC, UA 0-5 0 - 5 WBC/hpf   Bacteria, UA FEW (A) NONE SEEN   Squamous Epithelial / LPF 0-5 0 - 5   Mucus PRESENT    Hyaline Casts, UA PRESENT    Granular Casts, UA PRESENT    WBC Casts, UA PRESENT     Comment: Performed at [facility] [place], [address] [place], [place] [address]  Lactic acid, plasma     Status: None   Collection Time: 01/02/19 12:37 PM  Result Value Ref Range   Lactic Acid, Venous 1.7 0.5 - 1.9 mmol/L    Comment: Performed at [facility] [place], [address] [place], [place] [address]  D-dimer, quantitative     Status: Abnormal   Collection Time: 01/02/19 12:37 PM  Result Value Ref Range   D-Dimer, Quant 0.75 (H) 0.00 - 0.50 ug/mL-FEU    Comment: (NOTE) At the manufacturer cut-off of 0.50 ug/mL FEU, this assay has been documented to exclude PE with a sensitivity and negative predictive value of 97 to 99%.  At this time, this assay has not been approved by the FDA to exclude DVT/VTE. Results should be correlated with clinical presentation. Performed at [facility] [place], [address] [place], [place] [address]   Procalcitonin     Status: None    Collection Time: 01/02/19 12:37 PM  Result Value Ref Range   Procalcitonin <0.10 ng/mL    Comment:        Interpretation: PCT (Procalcitonin) <= 0.5 ng/mL: Systemic infection (sepsis) is not likely. Local bacterial infection is possible. (NOTE)       Sepsis PCT Algorithm           Lower Respiratory Tract                                      Infection PCT Algorithm    ----------------------------     ----------------------------         PCT < 0.25 ng/mL                PCT < 0.10 ng/mL  Strongly encourage             Strongly discourage   discontinuation of antibiotics    initiation of antibiotics    ----------------------------     -----------------------------       PCT 0.25 - 0.50 ng/mL            PCT 0.10 - 0.25 ng/mL               OR       >80% decrease in PCT            Discourage initiation of                                            antibiotics      Encourage discontinuation           of antibiotics    ----------------------------     -----------------------------         PCT >= 0.50 ng/mL              PCT 0.26 - 0.50 ng/mL               AND        <80% decrease in PCT             Encourage initiation of                                             antibiotics       Encourage continuation           of antibiotics    ----------------------------     -----------------------------        PCT >= 0.50 ng/mL                  PCT > 0.50 ng/mL               AND         increase in PCT                  Strongly encourage                                      initiation of antibiotics    Strongly encourage escalation           of antibiotics                                     -----------------------------                                           PCT <= 0.25 ng/mL                                                 OR                                        >   80% decrease in PCT                                     Discontinue / Do not initiate                                              antibiotics Performed at [facility], [address], [place], [place] [address]   Lactate dehydrogenase     Status: None   Collection Time: 01/02/19 12:37 PM  Result Value Ref Range   LDH 144 98 - 192 U/L    Comment: Performed at [facility], [address], [place], [place] [address]  Ferritin     Status: None   Collection Time: 01/02/19 12:37 PM  Result Value Ref Range   Ferritin 281 24 - 336 ng/mL    Comment: Performed at [facility], [address] [place], [place] [address]  Triglycerides     Status: Abnormal   Collection Time: 01/02/19 12:37 PM  Result Value Ref Range   Triglycerides 308 (H) <150 mg/dL    Comment: Performed at [facility], [address] [place], [place] [address]  Fibrinogen     Status: Abnormal   Collection Time: 01/02/19 12:37 PM  Result Value Ref Range   Fibrinogen 689 (H) 210 - 475 mg/dL    Comment: Performed at [facility], [address], [place], [place] [address]  C-reactive protein     Status: Abnormal   Collection Time: 01/02/19 12:37 PM  Result Value Ref Range   CRP 3.7 (H) <1.0 mg/dL    Comment: Performed at [facility], [address], [place], [place] [address]  Brain natriuretic peptide     Status: None   Collection Time: 01/02/19 12:37 PM  Result Value Ref Range   B Natriuretic Peptide 18.6 0.0 - 100.0 pg/mL    Comment: Performed at [facility], [address] [place], [place] [address]  SARS Coronavirus 2 Ag (30 min TAT) - Nasopharyngeal Swab     Status: Abnormal   Collection Time: 01/02/19 12:37 PM   Specimen: Nasopharyngeal Swab; Nasal Swab  Result Value Ref Range   SARS Coronavirus 2 Ag POSITIVE (A) NEGATIVE    Comment: RESULT CALLED TO, READ BACK BY AND VERIFIED WITH: MARVA SIMMS RN @1341  01/02/2019 OLSONM (NOTE) SARS-CoV-2 antigen PRESENT. Positive results indicate the presence of viral antigens, but clinical correlation with patient history and other  diagnostic information is necessary to determine patient infection status.  Positive results do not rule out bacterial infection or co-infection  with other viruses. False positive results are rare but can occur, and confirmatory RT-PCR testing may be appropriate in some circumstances. The expected result is Negative. Fact Sheet for Patients: 01/04/2019 Fact Sheet for Providers: https://sanders-williams.net/  This test is not yet approved or cleared by the https://martinez.com/ FDA and  has been authorized for detection and/or diagnosis of SARS-CoV-2 by FDA under an Emergency Use Authorization (EUA).  This EUA will remain in effect (meaning this test can be used) for the duration of  the COVID-19 decl aration under Section 564(b)(1) of the Act, 21 U.S.C. section 360bbb-3(b)(1), unless the authorization is terminated or revoked sooner. Performed at [facility]  [place], [address] [place], [place] [address]   SARS CORONAVIRUS 2 (TAT 6-24 HRS) Nasopharyngeal Nasopharyngeal Swab     Status: Abnormal   Collection Time: 01/02/19  1:04 PM   Specimen: Nasopharyngeal Swab  Result Value Ref Range   SARS Coronavirus 2 POSITIVE (A) NEGATIVE    Comment: RESULT CALLED TO, READ BACK BY AND VERIFIED WITH: B.PAUDEL,RN [id number] I.MANNING Performed at [facility], [address], [place], [place] [address]   CBG monitoring, ED     Status: Abnormal   Collection Time: 01/02/19  3:21 PM  Result Value Ref Range   Glucose-Capillary 103 (H) 70 - 99 mg/dL    Chemistries  Recent Labs  Lab 01/02/19 0957  NA 136  K 3.7  CL 97*  CO2 27  GLUCOSE 103*  BUN 40*  CREATININE 2.28*  CALCIUM 9.4  AST 23  ALT 26  ALKPHOS 58  BILITOT 0.8    ------------------------------------------------------------------------------------------------------------------  ------------------------------------------------------------------------------------------------------------------ GFR: Estimated Creatinine Clearance: 42.6 mL/min (A) (by C-G formula based on SCr of 2.28 mg/dL (H)). Liver Function Tests: Recent Labs  Lab 01/02/19 0957  AST 23  ALT 26  ALKPHOS 58  BILITOT 0.8  PROT 8.2*  ALBUMIN 4.1   No results for input(s): LIPASE, AMYLASE in the last 168 hours. No results for input(s): AMMONIA in the last 168 hours. Coagulation Profile: No results for input(s): INR, PROTIME in the last 168 hours. Cardiac Enzymes: No results for input(s): CKTOTAL, CKMB, CKMBINDEX, TROPONINI in the last 168 hours. BNP (last 3 results) No results for input(s): PROBNP in the last 8760 hours. HbA1C: No results for input(s): HGBA1C in the last 72 hours. CBG: Recent Labs  Lab 01/02/19 1521  GLUCAP 103*   Lipid Profile: Recent Labs    01/02/19 1237  TRIG 308*   Thyroid Function Tests: No results for input(s): TSH, T4TOTAL, FREET4, T3FREE, THYROIDAB in the last 72 hours. Anemia Panel: Recent Labs    01/02/19 1237  FERRITIN 281    --------------------------------------------------------------------------------------------------------------- Urine analysis:    Component Value Date/Time   COLORURINE YELLOW 01/02/2019 0957   APPEARANCEUR CLEAR 01/02/2019 0957   LABSPEC 1.020 01/02/2019 0957   PHURINE 6.0 01/02/2019 0957   GLUCOSEU >=500 (A) 01/02/2019 0957   HGBUR TRACE (A) 01/02/2019 0957   BILIRUBINUR NEGATIVE 01/02/2019 0957   KETONESUR NEGATIVE 01/02/2019 0957   PROTEINUR NEGATIVE 01/02/2019 0957   NITRITE NEGATIVE 01/02/2019 0957   LEUKOCYTESUR NEGATIVE 01/02/2019 0957      Imaging Results:    DG Chest Portable 1 View  Result Date: 01/02/2019 CLINICAL DATA:  Dyspnea, cough, headache EXAM: PORTABLE CHEST 1 VIEW  COMPARISON:  None. FINDINGS: Normal heart size. Normal mediastinal contour. No pneumothorax. No pleural effusion. Lungs appear clear, with no acute consolidative airspace disease and no pulmonary edema. IMPRESSION: No active disease. Electronically Signed   By: Delbert Phenix M.D.   On: 01/02/2019 09:12   ST at 130, nl axis, no st-t changes c/w ischemia    Assessment & Plan:    Active Problems:   COVID-19   COVID-19 virus infection  Covid-19 infection Dexamethasone  iv qday Remdesivir pharmacy consult Monitor  Sinus Tachycardia Tele Trop I q2h x2 Check tsh Check cardiac echo  + D dimer Check VQ scan r/o PE  ARF STOP Metformin Renal ultrasound Hydrate with ns in ED Check cmp in am  Dm2 STOP Metformin as above Hold Ozempic (due to diarrhea) Cont Farxiga  po qday fsbs ac and at  bedtime, ISS  H/o CHF (systolic), CAD Cont Aspirin Cont Plavix  po qday Cont Metoprolol  po bid Hold Entresto 1 po bid due to hypotension   DVT Prophylaxis-   Lovenox - SCDs   AM Labs Ordered, also please review Full Orders  Family Communication: Admission, patients condition and plan of care including tests being ordered have been discussed with the patient  who indicate understanding and agree with the plan and Code Status.  Code Status:  FULL CODE per patient  Admission status:    Inpatient: Based on patients clinical presentation and evaluation of above clinical data, I have made determination that patient meets Inpatient criteria at this time.   Pt will require iv steroids and iv remdesivir,  Consider [place].  Pt has high risk of clniical deterioration, pt will require > 2 nites stay  Time spent in minutes :  70  minutes   Pearson Grippe M.D on 01/02/2019 at 9:36 PM

## 2019-01-02 NOTE — Progress Notes (Signed)
Pt tx from [facility] for COVID. Remdesivir ordered. ALT wnl.  Remdesivir 200mg  IV x1 then 100mg  IV q24 x4 Monitor ALT  Onnie Boer, PharmD, [place], AAHIVP, CPP Infectious Disease Pharmacist 01/02/2019 9:31 PM

## 2019-01-02 NOTE — ED Notes (Signed)
Spoke with Talbert Forest, pt's brother (with pt's approval), and updated him of patient's current status.  Informed him that at this moment, we are waiting for lab results.

## 2019-01-02 NOTE — ED Triage Notes (Signed)
Chills, body aches, shortness of breath for 3-4 days and an excruciating headache.

## 2019-01-02 NOTE — ED Notes (Signed)
Pt's brother is informed that patient will be admitted and I will give him a call once I have the room assignment and the ambulance is here.

## 2019-01-02 NOTE — ED Notes (Signed)
Pt drinking water and a lean cuisine dinner being made and CBG checked. And RN Elba Barman informed.

## 2019-01-02 NOTE — Progress Notes (Signed)
Called for admission from [facility] patient Oscar Mack date of birth 1965/04/04  Patient is a 53 year old overweight male with a past medical history significant for diabetes mellitus and CAD status post stenting who presents with chief complaint of increased work of breathing, shortness of breath and tachycardia with associated fatigue and weakness.  Of note he states he tested + December 14 for Covid risk from [place].  Has had fevers and in the ED he had a temperature of 100.2.  He continues to be tachycardic and tachypneic.  Chest x-ray appears to be clear.  Inflammatory markers are still pending.  Continues to have sinus tachycardia and looks like a worsening AKI on possible CKD.  EDP was concerned about possible PE but unfortunately CTA cannot be done due to patient's renal function.  [place] does not have a VQ scan.  Blood cultures are pending and there is no clear source of infection currently but likely from COVID-19 disease.  Patient has a pending Covid test in our system and unfortunately unable to receive the Covid test that he had in [place].  Accepted to telemetry as he likely has Covid disease causing these symptoms.  Of note he is also had increased rhinorrhea and nasal congestion followed by chills, nonproductive cough, diarrhea and intermittent headaches with a fever of 102.  Likely will need steroids and Covid treatment but did not asked the EDP to give remdesivir as there is no evidence of pneumonia or hypoxia.  Accepted to telemetry observation for now.

## 2019-01-02 NOTE — ED Notes (Signed)
Report given to [facility]. ETA of 30 minutes until arrival of [facility].

## 2019-01-02 NOTE — ED Provider Notes (Signed)
[facility] EMERGENCY DEPARTMENT Provider Note   CSN: [id number] Arrival date & time: 01/02/19  9147     History Chief Complaint  Patient presents with  . Shortness of Breath    Oscar Mack is a 53 y.o. male.  HPI      Oscar Mack is a 53 y.o. male, with a history of DM and coronary stent, presenting to the ED with shortness of breath that began last night.  States he started having symptoms of rhinorrhea and nasal congestion around Dec 12. Tested positive for COVID Dec 14. Followed by chills, nonproductive cough, diarrhea, intermittent headaches, and body aches.  Fever noted three days ago, 102F.   Last dose of all medications was last night.  He is from [place] and has been in the area for about a month caring for his COVID positive mother.   Denies chest pain, syncope, abdominal pain, N/V, lower extremity edema/pain, or any other complaints.    Past Medical History:  Diagnosis Date  . Diabetes mellitus without complication (HCC)   . Heart failure [facility])     Patient Active Problem List   Diagnosis Date Noted  . COVID-19 01/02/2019    Past Surgical History:  Procedure Laterality Date  . CORONARY ANGIOPLASTY WITH STENT PLACEMENT  2018       Family History  Problem Relation Age of Onset  . Heart attack Father     Social History   Tobacco Use  . Smoking status: Never Smoker  . Smokeless tobacco: Never Used  Substance Use Topics  . Alcohol use: Never  . Drug use: Never    Home Medications Prior to Admission medications   Medication Sig Start Date End Date Taking? Authorizing Provider  aspirin EC 81 MG tablet Take 81 mg by mouth daily.   Yes [provider]  clopidogrel (PLAVIX) 75 MG tablet Take 75 mg by mouth daily.   Yes [provider]  Dapagliflozin Propanediol (FARXIGA PO) Take by mouth.   Yes [provider]  metFORMIN (GLUCOPHAGE) 500 MG tablet Take by mouth 2 (two) times daily with a meal.   Yes  [provider]  metoprolol tartrate (LOPRESSOR) 25 MG tablet Take 25 mg by mouth 2 (two) times daily.   Yes [provider]  Sacubitril-Valsartan (ENTRESTO PO) Take by mouth 2 (two) times daily.   Yes [provider]  Semaglutide (OZEMPIC, 1 MG/DOSE, [place]) Inject 1 mg into the skin once a week.   Yes [provider]    Allergies    Patient has no known allergies.  Review of Systems   Review of Systems  Constitutional: Positive for chills, diaphoresis, fatigue and fever.  Respiratory: Positive for cough and shortness of breath.   Cardiovascular: Negative for chest pain and leg swelling.  Gastrointestinal: Positive for diarrhea. Negative for abdominal pain, blood in stool, nausea and vomiting.  Musculoskeletal: Positive for myalgias.  Neurological: Positive for headaches. Negative for dizziness and syncope.  All other systems reviewed and are negative.   Physical Exam Updated Vital Signs BP 115/86 (BP Location: Right Arm)   Pulse (!) 121   Temp 99.5 F (37.5 C) (Oral)   Resp 16   Ht  (1.88 m)   Wt 97.9 kg   SpO2 98%   BMI 27.72 kg/m   Physical Exam Vitals and nursing note reviewed.  Constitutional:      General: He is not in acute distress.    Appearance: He is well-developed. He  is not diaphoretic.  HENT:     Head: Normocephalic and atraumatic.     Mouth/Throat:     Mouth: Mucous membranes are moist.     Pharynx: Oropharynx is clear.  Eyes:     Conjunctiva/sclera: Conjunctivae normal.  Cardiovascular:     Rate and Rhythm: Regular rhythm. Tachycardia present.     Pulses: Normal pulses.          Radial pulses are 2+ on the right side and 2+ on the left side.       Posterior tibial pulses are 2+ on the right side and 2+ on the left side.     Heart sounds: Normal heart sounds.     Comments: Tactile temperature in the extremities appropriate and equal bilaterally. Pulmonary:     Effort: Pulmonary effort is normal. No respiratory  distress.     Breath sounds: Normal breath sounds.  Abdominal:     Palpations: Abdomen is soft.     Tenderness: There is no abdominal tenderness. There is no guarding.  Musculoskeletal:     Cervical back: Neck supple.     Right lower leg: No edema.     Left lower leg: No edema.     Comments: No tenderness, swelling, increased warmth, or color change in the lower extremities.  Lymphadenopathy:     Cervical: No cervical adenopathy.  Skin:    General: Skin is warm and dry.  Neurological:     Mental Status: He is alert.  Psychiatric:        Mood and Affect: Mood and affect normal.        Speech: Speech normal.        Behavior: Behavior normal.     ED Results / Procedures / Treatments   Labs (all labs ordered are listed, but only abnormal results are displayed) Labs Reviewed  SARS CORONAVIRUS 2 AG (30 MIN TAT) - Abnormal; Notable for the following components:      Result Value   SARS Coronavirus 2 Ag POSITIVE (*)    All other components within normal limits  COMPREHENSIVE METABOLIC PANEL - Abnormal; Notable for the following components:   Chloride 97 (*)    Glucose, Bld 103 (*)    BUN 40 (*)    Creatinine, Ser 2.28 (*)    Total Protein 8.2 (*)    GFR calc non Af Amer 31 (*)    GFR calc Af Amer 36 (*)    All other components within normal limits  URINALYSIS, ROUTINE W REFLEX MICROSCOPIC - Abnormal; Notable for the following components:   Glucose, UA >=500 (*)    Hgb urine dipstick TRACE (*)    All other components within normal limits  URINALYSIS, MICROSCOPIC (REFLEX) - Abnormal; Notable for the following components:   Bacteria, UA FEW (*)    All other components within normal limits  D-DIMER, QUANTITATIVE (NOT AT [facility]) - Abnormal; Notable for the following components:   D-Dimer, Quant 0.75 (*)    All other components within normal limits  TRIGLYCERIDES - Abnormal; Notable for the following components:   Triglycerides 308 (*)    All other components within normal limits    FIBRINOGEN - Abnormal; Notable for the following components:   Fibrinogen 689 (*)    All other components within normal limits  C-REACTIVE PROTEIN - Abnormal; Notable for the following components:   CRP 3.7 (*)    All other components within normal limits  CBG MONITORING, ED - Abnormal; Notable for the following components:  Glucose-Capillary 103 (*)    All other components within normal limits  SARS CORONAVIRUS 2 (TAT 6-24 HRS)  CULTURE, BLOOD (ROUTINE X 2)  CULTURE, BLOOD (ROUTINE X 2)  CBC WITH DIFFERENTIAL/PLATELET  LACTIC ACID, PLASMA  LACTATE DEHYDROGENASE  FERRITIN  BRAIN NATRIURETIC PEPTIDE  LACTIC ACID, PLASMA  PROCALCITONIN    EKG EKG Interpretation  Date/Time:  Tuesday January 02 2019 09:25:46 EST Ventricular Rate:  131 PR Interval:    QRS Duration: 92 QT Interval:  313 QTC Calculation: 462 R Axis:   21 Text Interpretation: Sinus tachycardia Abnormal R-wave progression, early transition No previous ECGs available Confirmed by Gareth Morgan [phone]) on 01/02/2019 11:41:44 AM   Radiology DG Chest Portable 1 View  Result Date: 01/02/2019 CLINICAL DATA:  Dyspnea, cough, headache EXAM: PORTABLE CHEST 1 VIEW COMPARISON:  None. FINDINGS: Normal heart size. Normal mediastinal contour. No pneumothorax. No pleural effusion. Lungs appear clear, with no acute consolidative airspace disease and no pulmonary edema. IMPRESSION: No active disease. Electronically Signed   By: Ilona Sorrel M.D.   On: 01/02/2019 09:12    Procedures Procedures (including critical care time)  Medications Ordered in ED Medications  metFORMIN (GLUCOPHAGE) tablet 500 mg (500 mg Oral Given 01/02/19 1604)  clopidogrel (PLAVIX) tablet 75 mg (75 mg Oral Given 01/02/19 1605)  aspirin EC tablet 81 mg (81 mg Oral Not Given 01/02/19 1608)  sodium chloride 0.9 % bolus 500 mL (0 mLs Intravenous Stopped 01/02/19 1127)  acetaminophen (TYLENOL) tablet 1,000 mg (1,000 mg Oral Given 01/02/19 1301)   sodium chloride 0.9 % bolus 500 mL (0 mLs Intravenous Stopped 01/02/19 1611)  0.9 %  sodium chloride infusion (1,000 mLs Intravenous New Bag/Given 01/02/19 1613)    ED Course  I have reviewed the triage vital signs and the nursing notes.  Pertinent labs & imaging results that were available during my care of the patient were reviewed by me and considered in my medical decision making (see chart for details).  Clinical Course as of Jan 01 1702  Tue Jan 02, 2019  1218 Pulse still 120-130 bpm.  BP 112/78, last cycled at 12 pm. Respirations 24 bpm. Temp 100.2 orally. Lungs clear.    [SJ]  1307 Spoke with patient's brother, Barbaraann Rondo, at patient's request. Updated him on findings and plan for admission.    [SJ]  63 Spoke with Dr. Alfredia Ferguson, hospitalist. Agrees to admit the patient to [facility], telemetry observation, however, he states they do not have beds currently available and are also holding admitted patients in the ED. He agrees with the decision to forgo CT PE study due to patient's kidney function. Requests initiating fluid resuscitation for the patient. He states we should manage the patient how we see fit until he can be transferred to [facility].   [SJ]  [place] Patient denies feeling any different. No change in patient presentation or mental status.  BP(!): 92/56 [SJ]  1644 Patient states he feels better overall. Shortness of breath has improved.  Lungs continue to be clear.  He is able to lie supine without difficulty. Pulse rate of 102. Respirations 20. SPO2 96% on room air. BP 96/69.   [SJ]    Clinical Course User Index [SJ] Mason Burleigh C, PA-C   MDM Rules/Calculators/A&P                      Patient presents with shortness of breath with positive test for COVID-19 on December 14, reported by patient. Tachycardia and tachypnea both noted.  Patient initially afebrile, but became febrile during ED course.  Chest x-ray without acute abnormality. Tachycardia improved with IV fluids. He  did have some decrease in his blood pressure, however, he continued to voice improvement during my time with the patient.   Elevated creatinine and BUN.  Patient does state he was told that he has had an abnormally high creatinine in the past, however, he is unable to specify these previous numbers.  We also do not have these numbers in the system.  PE was considered, however, my suspicion was lower, especially in light of the rather low amount of D-dimer elevation.   My suspicion for other sources of shortness of breath, such as acute heart failure, is low as the patient has no orthopnea, no lower extremity edema, no abnormalities on chest x-ray, normal BNP, and normal lung exam.  Findings and plan of care discussed with Alvira Monday, MD.   Patient admitted due to his tachycardia and abnormal kidney function.  Due to the status of the area hospitals being over capacity and holding admitted patients in the ED system-wide his transfer to another hospital was delayed, despite being accepted for admission at another facility. We will continue to monitor him here at our facility until a bed at another facility becomes available.  The oncoming MD and PA were made aware of the patient as well as the proposed plan of care.  Vitals:   01/02/19 0910 01/02/19 0930 01/02/19 1300 01/02/19 1404  BP:  (!) 122/93 106/78 93/63  Pulse:  (!) 132 (!) 123 (!) 122  Resp:  (!) 21 (!) 22 (!) 21  Temp:    (!) 101.8 F (38.8 C)  TempSrc:    Oral  SpO2:  97% 96% 95%  Weight: 97.9 kg     Height: 6\' 2"  (1.88 m)      Vitals:   01/02/19 1430 01/02/19 1530 01/02/19 1600 01/02/19 1610  BP: (!) 92/56 92/71 96/73    Pulse: (!) 111 (!) 108 (!) 112   Resp: (!) 22 (!) 22 (!) 22   Temp:    98.3 F (36.8 C)  TempSrc:    Oral  SpO2: 96% 98% 96%   Weight:      Height:         Final Clinical Impression(s) / ED Diagnoses Final diagnoses:  Shortness of breath  COVID-19  Elevated serum creatinine    Rx / DC  Orders ED Discharge Orders    None       01/04/19 01/02/19 1703    Concepcion Living, MD 01/02/19 2108

## 2019-01-02 NOTE — ED Notes (Signed)
Pt eating meatloaf lean cuisine and orange juice and RN Elba Barman and [employer] informed.

## 2019-01-03 ENCOUNTER — Inpatient Hospital Stay (HOSPITAL_COMMUNITY): Payer: BLUE CROSS/BLUE SHIELD

## 2019-01-03 DIAGNOSIS — U071 COVID-19: Secondary | ICD-10-CM

## 2019-01-03 DIAGNOSIS — I2510 Atherosclerotic heart disease of native coronary artery without angina pectoris: Secondary | ICD-10-CM

## 2019-01-03 DIAGNOSIS — I5022 Chronic systolic (congestive) heart failure: Secondary | ICD-10-CM

## 2019-01-03 DIAGNOSIS — N179 Acute kidney failure, unspecified: Secondary | ICD-10-CM

## 2019-01-03 LAB — CBC WITH DIFFERENTIAL/PLATELET
Abs Immature Granulocytes: 0.09 10*3/uL — ABNORMAL HIGH (ref 0.00–0.07)
Basophils Absolute: 0 10*3/uL (ref 0.0–0.1)
Basophils Relative: 0 %
Eosinophils Absolute: 0 10*3/uL (ref 0.0–0.5)
Eosinophils Relative: 0 %
HCT: 46.5 % (ref 39.0–52.0)
Hemoglobin: 14.5 g/dL (ref 13.0–17.0)
Immature Granulocytes: 1 %
Lymphocytes Relative: 11 %
Lymphs Abs: 0.9 10*3/uL (ref 0.7–4.0)
MCH: 27.6 pg (ref 26.0–34.0)
MCHC: 31.2 g/dL (ref 30.0–36.0)
MCV: 88.6 fL (ref 80.0–100.0)
Monocytes Absolute: 0.3 10*3/uL (ref 0.1–1.0)
Monocytes Relative: 4 %
Neutro Abs: 6.7 10*3/uL (ref 1.7–7.7)
Neutrophils Relative %: 84 %
Platelets: 223 10*3/uL (ref 150–400)
RBC: 5.25 MIL/uL (ref 4.22–5.81)
RDW: 15 % (ref 11.5–15.5)
WBC: 8 10*3/uL (ref 4.0–10.5)
nRBC: 0 % (ref 0.0–0.2)

## 2019-01-03 LAB — COMPREHENSIVE METABOLIC PANEL
ALT: 26 U/L (ref 0–44)
AST: 22 U/L (ref 15–41)
Albumin: 3.8 g/dL (ref 3.5–5.0)
Alkaline Phosphatase: 46 U/L (ref 38–126)
Anion gap: 12 (ref 5–15)
BUN: 39 mg/dL — ABNORMAL HIGH (ref 6–20)
CO2: 18 mmol/L — ABNORMAL LOW (ref 22–32)
Calcium: 9 mg/dL (ref 8.9–10.3)
Chloride: 104 mmol/L (ref 98–111)
Creatinine, Ser: 2.26 mg/dL — ABNORMAL HIGH (ref 0.61–1.24)
GFR calc Af Amer: 36 mL/min — ABNORMAL LOW (ref >60)
GFR calc non Af Amer: 31 mL/min — ABNORMAL LOW (ref >60)
Glucose, Bld: 209 mg/dL — ABNORMAL HIGH (ref 70–99)
Potassium: 4.8 mmol/L (ref 3.5–5.1)
Sodium: 134 mmol/L — ABNORMAL LOW (ref 135–145)
Total Bilirubin: 0.3 mg/dL (ref 0.3–1.2)
Total Protein: 7.6 g/dL (ref 6.5–8.1)

## 2019-01-03 LAB — ECHOCARDIOGRAM COMPLETE
Height: 74 in
Weight: 3414.48 oz

## 2019-01-03 LAB — GLUCOSE, CAPILLARY
Glucose-Capillary: 122 mg/dL — ABNORMAL HIGH (ref 70–99)
Glucose-Capillary: 131 mg/dL — ABNORMAL HIGH (ref 70–99)
Glucose-Capillary: 131 mg/dL — ABNORMAL HIGH (ref 70–99)
Glucose-Capillary: 156 mg/dL — ABNORMAL HIGH (ref 70–99)
Glucose-Capillary: 205 mg/dL — ABNORMAL HIGH (ref 70–99)

## 2019-01-03 LAB — TSH: TSH: 0.59 u[IU]/mL (ref 0.350–4.500)

## 2019-01-03 LAB — TROPONIN I (HIGH SENSITIVITY): Troponin I (High Sensitivity): 8 ng/L (ref <18)

## 2019-01-03 LAB — LACTATE DEHYDROGENASE: LDH: 122 U/L (ref 98–192)

## 2019-01-03 LAB — ABO/RH: ABO/RH(D): O NEG

## 2019-01-03 LAB — FIBRINOGEN: Fibrinogen: 755 mg/dL — ABNORMAL HIGH (ref 210–475)

## 2019-01-03 LAB — HIV ANTIBODY (ROUTINE TESTING W REFLEX): HIV Screen 4th Generation wRfx: NONREACTIVE

## 2019-01-03 LAB — HEMOGLOBIN A1C
Hgb A1c MFr Bld: 7.5 % — ABNORMAL HIGH (ref 4.8–5.6)
Mean Plasma Glucose: 168.55 mg/dL

## 2019-01-03 LAB — C-REACTIVE PROTEIN: CRP: 11.2 mg/dL — ABNORMAL HIGH (ref <1.0)

## 2019-01-03 LAB — D-DIMER, QUANTITATIVE: D-Dimer, Quant: 0.5 ug/mL-FEU (ref 0.00–0.50)

## 2019-01-03 LAB — FERRITIN: Ferritin: 256 ng/mL (ref 24–336)

## 2019-01-03 MED ORDER — CANAGLIFLOZIN 100 MG PO TABS
100.0000 mg | ORAL_TABLET | Freq: Every day | ORAL | Status: DC
  Administered 2019-01-03: 100 mg via ORAL
  Filled 2019-01-03: qty 1

## 2019-01-03 MED ORDER — ASCORBIC ACID 500 MG PO TABS
500.0000 mg | ORAL_TABLET | Freq: Two times a day (BID) | ORAL | Status: DC
  Administered 2019-01-03 – 2019-01-06 (×6): 500 mg via ORAL
  Filled 2019-01-03 (×6): qty 1

## 2019-01-03 MED ORDER — ZINC SULFATE 220 (50 ZN) MG PO CAPS
220.0000 mg | ORAL_CAPSULE | Freq: Every day | ORAL | Status: DC
  Administered 2019-01-03 – 2019-01-06 (×4): 220 mg via ORAL
  Filled 2019-01-03 (×4): qty 1

## 2019-01-03 MED ORDER — LOPERAMIDE HCL 2 MG PO CAPS
4.0000 mg | ORAL_CAPSULE | Freq: Once | ORAL | Status: AC
  Administered 2019-01-03: 4 mg via ORAL
  Filled 2019-01-03: qty 2

## 2019-01-03 MED ORDER — SODIUM CHLORIDE 0.9 % IV SOLN: Freq: Once | INTRAVENOUS | Status: AC

## 2019-01-03 MED ORDER — TECHNETIUM TO 99M ALBUMIN AGGREGATED
1.4700 | Freq: Once | INTRAVENOUS | Status: AC | PRN
  Administered 2019-01-03: 1.47 via INTRAVENOUS

## 2019-01-03 NOTE — Progress Notes (Addendum)
[facility]                                                                              Oscar Mack Demographics  Oscar Mack, is a 53 y.o. male, DOB - 1965-09-12, ZHG:[id number]  Admit date - 01/02/2019   Admitting Physician Jani Gravel, MD  Outpatient Primary MD for the Oscar Mack is Oscar Mack, No Pcp Per  Outpatient specialists:   LOS - 1  days   Medical records reviewed and are as summarized below:    Chief Complaint  Oscar Mack presents with  . Shortness of Breath       Brief summary   Oscar Mack is a 53 year old male visiting from [place], history of diabetes mellitus 2, CAD status post stentx 2, CHF (systolic), presented with dyspnea, tachycardia, nasal congestion, cough, diarrhea, intermittent headaches.  Fevers, 102 F, myalgias.  In ED, temp was 99.5 F.  Chest x-ray normal. Creatinine 2.28 procalcitonin less than 0.1  COVID-19 test positive   Assessment & Plan   Principal problem  1. Acute Hypoxic Resp. Failure due to Acute Covid 19 Viral Pneumonia during the ongoing 2020 Covid 19 Pandemic - POA, sepsis - Oscar Mack met sepsis criteria secondary to fevers, tachycardia, acute renal insufficiency, likely due to COVID-19 viral pneumonitis.   Chest x-ray showed no acute pneumonia -  Has known sick contact in family members -Oscar Mack started on dexamethasone 6 mg IV daily, Remdesivir per pharmacy protocol  -   continue Supportive care:    Vitamin C/zinc, albuterol,  Tylenol. - Continue to wean oxygen, ambulatory O2 screening daily as tolerated  - Oxygen - SpO2: 96 %  -VQ scan showed no PE - Continue to follow labs as below  Lab Results  Component Value Date   SARSCOV2NAA POSITIVE (A) 01/02/2019     Recent Labs  Lab 01/02/19 0957 01/02/19 1237 01/03/19 0316 01/03/19 0847  DDIMER  --  0.75*  --  0.50  FERRITIN  --  281  --   --   CRP  --  3.7*  --   --   ALT 26  --  26  --   PROCALCITON  --  <0.10  --   --       Active Problems:     ARF (acute renal  failure) ([place]) -Possibly may have underlying CKD, no baseline creatinine available in epic.  Oscar Mack visiting from [place].  Reports that he does not remember being told about CKD. -Continue gentle hydration, hold Metformin, Entresto. -Renal ultrasound negative for any obstruction or hydronephrosis -Possibly prerenal secondary to multiple episodes of diarrhea, dehydration and poor p.o. intake    CAD (coronary artery disease),  Chronic systolic and diastolic CHF (congestive heart failure) (HCC) -Currently no chest pain -2D echo showed EF of 30 to 35% with moderate to severe decreased left ventricular function, increased LVH, grade 1 diastolic dysfunction -Cautious IV fluid hydration for 1 L only to avoid volume overload. -Continue aspirin, Plavix, beta-blocker  Diabetes mellitus type 2, NIDDM -Hold Farxiga, Glucophage, Ozempic -Continue sliding scale insulin -Hemoglobin A1c 7.5   Code Status: Full CODE STATUS DVT Prophylaxis:  Lovenox  Family Communication: Discussed all imaging  results, lab results, explained to the Oscar Mack   Disposition Plan: Pending clinical status, currently inpatient given acute hypoxic respiratory failure, acute COVID-19 illness.  Once ambulatory without overt symptoms or hypoxia would consider discharge home.    Time Spent in minutes 35 minutes  Procedures:  None  Consultants:   None  Antimicrobials:   Anti-infectives (From admission, onward)   Start     Dose/Rate Route Frequency Ordered Stop   01/03/19 1600  remdesivir 100 mg in sodium chloride 0.9 % 100 mL IVPB     100 mg 200 mL/hr over 30 Minutes Intravenous Daily 01/02/19 2126 01/07/19 0959   01/02/19 2200  remdesivir 200 mg in sodium chloride 0.9% 250 mL IVPB     200 mg 580 mL/hr over 30 Minutes Intravenous Once 01/02/19 2126 01/02/19 2310          Medications  Scheduled Meds: . aspirin EC  81 mg Oral Daily  . clopidogrel  75 mg Oral Daily  . dexamethasone (DECADRON) injection  6 mg  Intravenous QHS  . enoxaparin (LOVENOX) injection  40 mg Subcutaneous QHS  . insulin aspart  0-5 Units Subcutaneous QHS  . insulin aspart  0-9 Units Subcutaneous TID WC  . metoprolol tartrate  25 mg Oral BID   Continuous Infusions: . remdesivir 100 mg in NS 100 mL     PRN Meds:.acetaminophen      Subjective:   Eman Morimoto was seen and examined today.  Complaining of diarrhea, generalized weakness.  Shortness of breath improving.  No fevers or chills.  No abdominal pain.  No hematochezia or melena  Objective:   Vitals:   01/03/19 0639 01/03/19 0827 01/03/19 0915 01/03/19 1207  BP: 111/64 116/77  138/86  Pulse: 79 77  85  Resp: '18 18  14  ' Temp: 98.5 F (36.9 C)  (!) 97.5 F (36.4 C) 98.4 F (36.9 C)  TempSrc: Oral  Oral Oral  SpO2: 94% 94%  96%  Weight:      Height:        Intake/Output Summary (Last 24 hours) at 01/03/2019 1636 Last data filed at 01/03/2019 1400 Gross per 24 hour  Intake 900.33 ml  Output 400 ml  Net 500.33 ml     Wt Readings from Last 3 Encounters:  01/02/19 96.8 kg     Exam  General: Alert and oriented x 3, NAD  Eyes:  HEENT:  Atraumatic, normocephalic  Cardiovascular: S1 S2 auscultated, no murmurs, RRR  Respiratory: Decreased breath sound at the bases  Gastrointestinal: Soft, nontender, nondistended, + bowel sounds  Ext: no pedal edema bilaterally  Neuro: No new FND's  Musculoskeletal: No digital cyanosis, clubbing  Skin: No rashes  Psych: Normal affect and demeanor, alert and oriented x3    Data Reviewed:  I have personally reviewed following labs and imaging studies  Micro Results Recent Results (from the past 240 hour(s))  SARS Coronavirus 2 Ag (30 min TAT) - Nasopharyngeal Swab     Status: Abnormal   Collection Time: 01/02/19 12:37 PM   Specimen: Nasopharyngeal Swab; Nasal Swab  Result Value Ref Range Status   SARS Coronavirus 2 Ag POSITIVE (A) NEGATIVE Final    Comment: RESULT CALLED TO, READ BACK BY AND  VERIFIED WITH: MARVA SIMMS RN '@[id number]'  01/02/2019 OLSONM (NOTE) SARS-CoV-2 antigen PRESENT. Positive results indicate the presence of viral antigens, but clinical correlation with Oscar Mack history and other diagnostic information is necessary to determine Oscar Mack infection status.  Positive results do not rule out bacterial infection  or co-infection  with other viruses. False positive results are rare but can occur, and confirmatory RT-PCR testing may be appropriate in some circumstances. The expected result is Negative. Fact Sheet for Patients: PodPark.tn Fact Sheet for Providers: GiftContent.is  This test is not yet approved or cleared by the [place] FDA and  has been authorized for detection and/or diagnosis of SARS-CoV-2 by FDA under an Emergency Use Authorization (EUA).  This EUA will remain in effect (meaning this test can be used) for the duration of  the COVID-19 decl aration under Section 564(b)(1) of the Act, 21 U.S.C. section 360bbb-3(b)(1), unless the authorization is terminated or revoked sooner. Performed at [facility], [address] [place], [place] [address]   Blood Culture (routine x 2)     Status: None (Preliminary result)   Collection Time: 01/02/19 12:47 PM   Specimen: BLOOD  Result Value Ref Range Status   Specimen Description   Final    BLOOD RIGHT ANTECUBITAL Performed at [facility], [address] [place], [place] [address]    Special Requests   Final    BOTTLES DRAWN AEROBIC AND ANAEROBIC Blood Culture adequate volume Performed at [facility], [address] [place], [place] [address]    Culture   Final    NO GROWTH < 24 HOURS Performed at [facility], [place] [address] [place], [place] [address]    Report Status PENDING  Incomplete  Blood Culture (routine x 2)     Status: None (Preliminary result)   Collection Time: 01/02/19 12:57 PM    Specimen: BLOOD  Result Value Ref Range Status   Specimen Description   Final    BLOOD LEFT ANTECUBITAL Performed at [facility], [address] [place], [place] [address]    Special Requests   Final    BOTTLES DRAWN AEROBIC AND ANAEROBIC Blood Culture adequate volume Performed at [facility], [address] [place], [place] [address]    Culture   Final    NO GROWTH < 24 HOURS Performed at [facility], [place] [address] [place], [place] [address]    Report Status PENDING  Incomplete  SARS CORONAVIRUS 2 (TAT 6-24 HRS) Nasopharyngeal Nasopharyngeal Swab     Status: Abnormal   Collection Time: 01/02/19  1:04 PM   Specimen: Nasopharyngeal Swab  Result Value Ref Range Status   SARS Coronavirus 2 POSITIVE (A) NEGATIVE Final    Comment: RESULT CALLED TO, READ BACK BY AND VERIFIED WITH: B.PAUDEL,RN [id number] I.MANNING Performed at [facility], [place] [address], [place], [place] [address]     Radiology Reports NM Pulmonary Perfusion  Result Date: 01/03/2019 CLINICAL DATA:  Positive D-dimer EXAM: NUCLEAR MEDICINE PERFUSION LUNG SCAN TECHNIQUE: Perfusion images were obtained in multiple projections after intravenous injection of radiopharmaceutical. Ventilation scans intentionally deferred if perfusion scan and chest x-ray adequate for interpretation during COVID 19 epidemic. RADIOPHARMACEUTICALS:  1.47 mCi Tc-37mMAA IV COMPARISON:  Chest x-ray 02/01/2018 FINDINGS: No perfusion defects seen to suggest pulmonary embolus. IMPRESSION: No evidence of pulmonary embolus. Electronically Signed   By: KRolm BaptiseM.D.   On: 01/03/2019 11:50   [place]NAL  Result Date: 01/03/2019 CLINICAL DATA:  Acute renal failure EXAM: RENAL / URINARY TRACT ULTRASOUND COMPLETE COMPARISON:  None. FINDINGS: Right Kidney: Renal measurements: 11.4 x 5.3 x 5.6 = volume: 175.2 mL . Echogenicity within normal limits. No mass or hydronephrosis  visualized. Left Kidney: Renal  measurements: 11.3 x 6.1 x 5.6 = volume: 200.8 mL. Echogenicity within normal limits. No mass or hydronephrosis visualized. Bladder: Appears normal for degree of bladder distention. Other: Diffusely increased hepatic echogenicity with loss of definition of the portal triads and diminished posterior through transmission compatible with hepatic steatosis. Enlarged prostate is incidentally noted as well measuring 6.2 x 4.6 x 5.4 cm for a volume of 82 mL. IMPRESSION: Unremarkable renal ultrasound. Prostatomegaly. Diffusely increased hepatic echogenicity, most commonly seen with hepatic steatosis. Electronically Signed   By: Lovena Le M.D.   On: 01/03/2019 01:02   DG Chest Port 1 View  Result Date: 01/03/2019 CLINICAL DATA:  Dyspnea, tachycardia, nasal congestion, cough, diarrhea, and headache. COVID-19 positive earlier this month. EXAM: PORTABLE CHEST 1 VIEW COMPARISON:  01/02/2019 FINDINGS: The cardiac silhouette is borderline enlarged. No airspace consolidation, edema, pleural effusion, pneumothorax is identified. No acute osseous abnormality is seen. IMPRESSION: No active disease. Electronically Signed   By: Logan Bores M.D.   On: 01/03/2019 12:34   DG Chest Portable 1 View  Result Date: 01/02/2019 CLINICAL DATA:  Dyspnea, cough, headache EXAM: PORTABLE CHEST 1 VIEW COMPARISON:  None. FINDINGS: Normal heart size. Normal mediastinal contour. No pneumothorax. No pleural effusion. Lungs appear clear, with no acute consolidative airspace disease and no pulmonary edema. IMPRESSION: No active disease. Electronically Signed   By: Ilona Sorrel M.D.   On: 01/02/2019 09:12   ECHOCARDIOGRAM COMPLETE  Result Date: 01/03/2019   ECHOCARDIOGRAM REPORT   Oscar Mack Name:   Oscar Mack Date of Exam: 01/03/2019 Medical Rec #:  [id number]    Height:       74.0 in Accession #:    [id number]   Weight:       213.4 lb Date of Birth:  1965-10-05    BSA:          2.23 m Oscar Mack Age:    54 years     BP:           138/86 mmHg  Oscar Mack Gender: M            HR:           80 bpm. Exam Location:  Inpatient Procedure: 2D Echo, Color Doppler and Cardiac Doppler Indications:    R00.0 Tachycardia  History:        Oscar Mack has no prior history of Echocardiogram examinations.                 CHF, CAD; Risk Factors:Diabetes. COVID+ on 01/02/19.  Sonographer:    Raquel Sarna Senior RDCS Referring Phys: [place]  1. Left ventricular ejection fraction, by visual estimation, is 30 to 35%. The left ventricle has moderate to severely decreased function. There is moderately increased left ventricular hypertrophy.  2. Left ventricular diastolic parameters are consistent with Grade I diastolic dysfunction (impaired relaxation).  3. The left ventricle demonstrates global hypokinesis.  4. Global right ventricle has normal systolic function.The right ventricular size is normal.  5. Left atrial size was normal.  6. Right atrial size was normal.  7. Trivial pericardial effusion is present.  8. The mitral valve is normal in structure. No evidence of mitral valve regurgitation. No evidence of mitral stenosis.  9. The tricuspid valve is normal in structure. 10. The aortic valve is tricuspid. Aortic valve regurgitation is not visualized. No evidence of aortic valve sclerosis or stenosis. 11. The pulmonic valve was normal in structure. Pulmonic valve regurgitation is not visualized. 12. The  inferior vena cava is normal in size with greater than 50% respiratory variability, suggesting right atrial pressure of 3 mmHg. 13. Moderate to severe global reduction in LV systolic function; grade 1 diastolic dysfunction; moderate LVH. FINDINGS  Left Ventricle: Left ventricular ejection fraction, by visual estimation, is 30 to 35%. The left ventricle has moderate to severely decreased function. The left ventricle demonstrates global hypokinesis. There is moderately increased left ventricular hypertrophy. Left ventricular diastolic parameters are consistent with Grade I  diastolic dysfunction (impaired relaxation). Normal left atrial pressure. Right Ventricle: The right ventricular size is normal.Global RV systolic function is has normal systolic function. Left Atrium: Left atrial size was normal in size. Right Atrium: Right atrial size was normal in size Pericardium: Trivial pericardial effusion is present. Mitral Valve: The mitral valve is normal in structure. No evidence of mitral valve regurgitation. No evidence of mitral valve stenosis by observation. Tricuspid Valve: The tricuspid valve is normal in structure. Tricuspid valve regurgitation is trivial. Aortic Valve: The aortic valve is tricuspid. Aortic valve regurgitation is not visualized. The aortic valve is structurally normal, with no evidence of sclerosis or stenosis. Pulmonic Valve: The pulmonic valve was normal in structure. Pulmonic valve regurgitation is not visualized. Pulmonic regurgitation is not visualized. Aorta: The aortic root is normal in size and structure. Venous: The inferior vena cava is normal in size with greater than 50% respiratory variability, suggesting right atrial pressure of 3 mmHg.  Additional Comments: Moderate to severe global reduction in LV systolic function; grade 1 diastolic dysfunction; moderate LVH.  LEFT VENTRICLE PLAX 2D LVIDd:         4.00 cm  Diastology LVIDs:         2.60 cm  LV e' lateral:   6.96 cm/s LV PW:         1.20 cm  LV E/e' lateral: 6.0 LV IVS:        1.40 cm  LV e' medial:    5.44 cm/s LVOT diam:     2.30 cm  LV E/e' medial:  7.7 LV SV:         45 ml LV SV Index:   20.07 LVOT Area:     4.15 cm  RIGHT VENTRICLE RV S prime:     7.51 cm/s TAPSE (M-mode): 1.8 cm LEFT ATRIUM             Index       RIGHT ATRIUM           Index LA diam:        3.60 cm 1.61 cm/m  RA Area:     12.50 cm LA Vol (A2C):   41.0 ml 18.35 ml/m RA Volume:   26.10 ml  11.68 ml/m LA Vol (A4C):   37.1 ml 16.61 ml/m LA Biplane Vol: 42.4 ml 18.98 ml/m  AORTIC VALVE LVOT Vmax:   58.10 cm/s LVOT Vmean:   45.000 cm/s LVOT VTI:    0.113 m  AORTA Ao Root diam: 3.20 cm Ao Asc diam:  3.30 cm MITRAL VALVE MV Area (PHT): 1.84 cm             SHUNTS MV PHT:        119.48 msec          Systemic VTI:  0.11 m MV Decel Time: 412 msec             Systemic Diam: 2.30 cm MV E velocity: 42.00 cm/s 103 cm/s MV A velocity: 54.00 cm/s 70.3 cm/s MV  E/A ratio:  0.78       1.5  Kirk Ruths MD Electronically signed by Kirk Ruths MD Signature Date/Time: 01/03/2019/2:34:36 PM    Final     Lab Data:  CBC: Recent Labs  Lab 01/02/19 0957 01/03/19 0316  WBC 9.8 8.0  NEUTROABS 7.7 6.7  HGB 15.9 14.5  HCT 49.1 46.5  MCV 87.1 88.6  PLT 241 149   Basic Metabolic Panel: Recent Labs  Lab 01/02/19 0957 01/03/19 0316  NA 136 134*  K 3.7 4.8  CL 97* 104  CO2 27 18*  GLUCOSE 103* 209*  BUN 40* 39*  CREATININE 2.28* 2.26*  CALCIUM 9.4 9.0   GFR: Estimated Creatinine Clearance: 42.9 mL/min (A) (by C-G formula based on SCr of 2.26 mg/dL (H)). Liver Function Tests: Recent Labs  Lab 01/02/19 0957 01/03/19 0316  AST 23 22  ALT 26 26  ALKPHOS 58 46  BILITOT 0.8 0.3  PROT 8.2* 7.6  ALBUMIN 4.1 3.8   No results for input(s): LIPASE, AMYLASE in the last 168 hours. No results for input(s): AMMONIA in the last 168 hours. Coagulation Profile: No results for input(s): INR, PROTIME in the last 168 hours. Cardiac Enzymes: No results for input(s): CKTOTAL, CKMB, CKMBINDEX, TROPONINI in the last 168 hours. BNP (last 3 results) No results for input(s): PROBNP in the last 8760 hours. HbA1C: Recent Labs    01/03/19 0316  HGBA1C 7.5*   CBG: Recent Labs  Lab 01/02/19 1521 01/02/19 2237 01/03/19 0019 01/03/19 0750 01/03/19 1205  GLUCAP 103* 68* 205* 131* 131*   Lipid Profile: Recent Labs    01/02/19 1237  TRIG 308*   Thyroid Function Tests: Recent Labs    01/03/19 0316  TSH 0.590   Anemia Panel: Recent Labs    01/02/19 1237  FERRITIN 281   Urine analysis:    Component Value Date/Time    COLORURINE YELLOW 01/02/2019 0957   APPEARANCEUR CLEAR 01/02/2019 0957   LABSPEC 1.020 01/02/2019 0957   PHURINE 6.0 01/02/2019 0957   GLUCOSEU >=500 (A) 01/02/2019 0957   HGBUR TRACE (A) 01/02/2019 0957   BILIRUBINUR NEGATIVE 01/02/2019 0957   KETONESUR NEGATIVE 01/02/2019 0957   PROTEINUR NEGATIVE 01/02/2019 0957   NITRITE NEGATIVE 01/02/2019 0957   LEUKOCYTESUR NEGATIVE 01/02/2019 0957     Vernice Mannina M.D. [facility] 01/03/2019, 4:36 PM   Call night coverage person covering after 7pm

## 2019-01-03 NOTE — Progress Notes (Signed)
Echocardiogram 2D Echocardiogram has been performed.  Oneal Deputy Rasul Decola 01/03/2019, 1:47 PM

## 2019-01-03 NOTE — Progress Notes (Signed)
Writer spoke with Pts Brother and updated his condition.

## 2019-01-03 NOTE — Plan of Care (Signed)
  Problem: Education: Goal: Knowledge of risk factors and measures for prevention of condition will improve Outcome: Progressing   Problem: Coping: Goal: Psychosocial and spiritual needs will be supported Outcome: Progressing   Problem: Respiratory: Goal: Will maintain a patent airway Outcome: Progressing   Problem: Education: Goal: Knowledge of General Education information will improve Description: Including pain rating scale, medication(s)/side effects and non-pharmacologic comfort measures Outcome: Progressing   

## 2019-01-04 LAB — D-DIMER, QUANTITATIVE: D-Dimer, Quant: 0.64 ug/mL-FEU — ABNORMAL HIGH (ref 0.00–0.50)

## 2019-01-04 LAB — GLUCOSE, CAPILLARY
Glucose-Capillary: 137 mg/dL — ABNORMAL HIGH (ref 70–99)
Glucose-Capillary: 119 mg/dL — ABNORMAL HIGH (ref 70–99)
Glucose-Capillary: 120 mg/dL — ABNORMAL HIGH (ref 70–99)
Glucose-Capillary: 147 mg/dL — ABNORMAL HIGH (ref 70–99)

## 2019-01-04 LAB — CBC WITH DIFFERENTIAL/PLATELET
Abs Immature Granulocytes: 0.09 10*3/uL — ABNORMAL HIGH (ref 0.00–0.07)
Basophils Absolute: 0 10*3/uL (ref 0.0–0.1)
Basophils Relative: 0 %
Eosinophils Absolute: 0 10*3/uL (ref 0.0–0.5)
Eosinophils Relative: 0 %
HCT: 40.3 % (ref 39.0–52.0)
Hemoglobin: 12.5 g/dL — ABNORMAL LOW (ref 13.0–17.0)
Immature Granulocytes: 2 %
Lymphocytes Relative: 19 %
Lymphs Abs: 1 10*3/uL (ref 0.7–4.0)
MCH: 27.5 pg (ref 26.0–34.0)
MCHC: 31 g/dL (ref 30.0–36.0)
MCV: 88.8 fL (ref 80.0–100.0)
Monocytes Absolute: 0.3 10*3/uL (ref 0.1–1.0)
Monocytes Relative: 6 %
Neutro Abs: 3.7 10*3/uL (ref 1.7–7.7)
Neutrophils Relative %: 73 %
Platelets: 195 10*3/uL (ref 150–400)
RBC: 4.54 MIL/uL (ref 4.22–5.81)
RDW: 14.9 % (ref 11.5–15.5)
WBC: 5.1 10*3/uL (ref 4.0–10.5)
nRBC: 0 % (ref 0.0–0.2)

## 2019-01-04 LAB — COMPREHENSIVE METABOLIC PANEL
ALT: 21 U/L (ref 0–44)
AST: 16 U/L (ref 15–41)
Albumin: 3.2 g/dL — ABNORMAL LOW (ref 3.5–5.0)
Alkaline Phosphatase: 41 U/L (ref 38–126)
Anion gap: 12 (ref 5–15)
BUN: 48 mg/dL — ABNORMAL HIGH (ref 6–20)
CO2: 19 mmol/L — ABNORMAL LOW (ref 22–32)
Calcium: 8.7 mg/dL — ABNORMAL LOW (ref 8.9–10.3)
Chloride: 105 mmol/L (ref 98–111)
Creatinine, Ser: 1.77 mg/dL — ABNORMAL HIGH (ref 0.61–1.24)
GFR calc Af Amer: 49 mL/min — ABNORMAL LOW (ref >60)
GFR calc non Af Amer: 42 mL/min — ABNORMAL LOW (ref >60)
Glucose, Bld: 161 mg/dL — ABNORMAL HIGH (ref 70–99)
Potassium: 4.7 mmol/L (ref 3.5–5.1)
Sodium: 136 mmol/L (ref 135–145)
Total Bilirubin: 0.5 mg/dL (ref 0.3–1.2)
Total Protein: 6.8 g/dL (ref 6.5–8.1)

## 2019-01-04 LAB — C-REACTIVE PROTEIN: CRP: 6.9 mg/dL — ABNORMAL HIGH (ref <1.0)

## 2019-01-04 LAB — FERRITIN: Ferritin: 313 ng/mL (ref 24–336)

## 2019-01-04 LAB — FIBRINOGEN: Fibrinogen: 669 mg/dL — ABNORMAL HIGH (ref 210–475)

## 2019-01-04 LAB — LACTATE DEHYDROGENASE: LDH: 118 U/L (ref 98–192)

## 2019-01-04 MED ORDER — LORATADINE 10 MG PO TABS
10.0000 mg | ORAL_TABLET | Freq: Every day | ORAL | Status: DC
  Administered 2019-01-04: 10 mg via ORAL
  Filled 2019-01-04: qty 1

## 2019-01-04 MED ORDER — CETIRIZINE HCL 10 MG PO TABS
10.0000 mg | ORAL_TABLET | Freq: Every day | ORAL | Status: DC
  Administered 2019-01-04 – 2019-01-06 (×3): 10 mg via ORAL
  Filled 2019-01-04 (×3): qty 1

## 2019-01-04 MED ORDER — GUAIFENESIN ER 600 MG PO TB12
600.0000 mg | ORAL_TABLET | Freq: Two times a day (BID) | ORAL | Status: DC
  Administered 2019-01-04 – 2019-01-06 (×5): 600 mg via ORAL
  Filled 2019-01-04 (×5): qty 1

## 2019-01-04 MED ORDER — BENZONATATE 100 MG PO CAPS
100.0000 mg | ORAL_CAPSULE | Freq: Three times a day (TID) | ORAL | Status: DC
  Administered 2019-01-04 – 2019-01-06 (×7): 100 mg via ORAL
  Filled 2019-01-04 (×7): qty 1

## 2019-01-04 MED ORDER — PSEUDOEPHEDRINE HCL ER 120 MG PO TB12
120.0000 mg | ORAL_TABLET | Freq: Two times a day (BID) | ORAL | Status: DC
  Administered 2019-01-04 – 2019-01-06 (×4): 120 mg via ORAL
  Filled 2019-01-04 (×5): qty 1

## 2019-01-04 MED ORDER — FLUTICASONE PROPIONATE 50 MCG/ACT NA SUSP
1.0000 | Freq: Every day | NASAL | Status: DC
  Administered 2019-01-04 – 2019-01-06 (×3): 1 via NASAL
  Filled 2019-01-04: qty 16

## 2019-01-04 NOTE — Progress Notes (Signed)
[facility]                                                                              Patient Demographics  Oscar Mack, is a 53 y.o. male, DOB - 1965-03-27, XJO:[id number]  Admit date - 01/02/2019   Admitting Physician Jani Gravel, MD  Outpatient Primary MD for the patient is Patient, No Pcp Per  Outpatient specialists:   LOS - 2  days   Medical records reviewed and are as summarized below:    Chief Complaint  Patient presents with  . Shortness of Breath       Brief summary   Patient is a 53 year old male visiting from [place], history of diabetes mellitus 2, CAD status post stentx 2, CHF (systolic), presented with dyspnea, tachycardia, nasal congestion, cough, diarrhea, intermittent headaches.  Fevers, 102 F, myalgias.  In ED, temp was 99.5 F.  Chest x-ray normal. Creatinine 2.28 procalcitonin less than 0.1  COVID-19 test positive   Assessment & Plan   Principal problem  1. Acute Hypoxic Resp. Failure due to Acute Covid 19 Viral Pneumonia during the ongoing 2020 Covid 19 Pandemic - POA, sepsis - Patient met sepsis criteria secondary to fevers, tachycardia, acute renal insufficiency, likely due to COVID-19 viral pneumonitis.   Chest x-ray showed no acute pneumonia -  Has known sick contact in family members -Continue IV dexamethasone 6 mg daily, remdesivir per pharmacy protocol, day #3 today   -   continue Supportive care:  Vitamin C/zinc, albuterol,  Tylenol. - Continue to wean oxygen, ambulatory O2 screening daily as tolerated  - Oxygen - SpO2: 97 %.  Currently on room air, improving - VQ scan showed no PE - Continue to follow labs as below  Lab Results  Component Value Date   SARSCOV2NAA POSITIVE (A) 01/02/2019     Recent Labs  Lab 01/02/19 0957 01/02/19 1237 01/03/19 0316 01/03/19 0847 01/04/19 0307  DDIMER  --  0.75*  --  0.50 0.64*  FERRITIN  --  281  --  256 313  CRP  --  3.7*  --  11.2* 6.9*  ALT 26  --  26  --  21  PROCALCITON   --  <0.10  --   --   --       Active Problems:     ARF (acute renal failure) (HCC) -Possibly may have underlying CKD, no baseline creatinine available in epic.  Patient visiting from [place].  Reports that he does not remember being told about CKD. -Continue to hold Metformin, Entresto -Renal ultrasound negative for any obstruction or hydronephrosis -Possibly prerenal secondary to multiple episodes of diarrhea, dehydration and poor p.o. intake -Creatinine improving, 1.7 today.  Patient was placed on gentle hydration for 1 L.    CAD (coronary artery disease),  Chronic systolic and diastolic CHF (congestive heart failure) ([place]) -Currently no chest pain.  Currently euvolemic, stable, +2L -2D echo showed EF of 30 to 35% with moderate to severe decreased left ventricular function, increased LVH, grade 1 diastolic dysfunction -Continue aspirin, Plavix, beta-blocker  Diabetes mellitus type 2, NIDDM -Hold Farxiga, Glucophage, Ozempic -Continue sliding scale insulin -Hemoglobin A1c 7.5  Code Status: Full CODE STATUS DVT Prophylaxis:  Lovenox  Family Communication: Discussed all imaging results, lab results, explained to the patient   Disposition Plan: Pending clinical status, currently inpatient given acute hypoxic respiratory failure, acute COVID-19 illness.  Once ambulatory without overt symptoms or hypoxia would consider discharge home.  Likely DC home after he finishes remdesivir.  Outpatient infusion clinic closed over the holidays.    Time Spent in minutes 35 minutes  Procedures:  None  Consultants:   None  Antimicrobials:   Anti-infectives (From admission, onward)   Start     Dose/Rate Route Frequency Ordered Stop   01/03/19 1600  remdesivir 100 mg in sodium chloride 0.9 % 100 mL IVPB     100 mg 200 mL/hr over 30 Minutes Intravenous Daily 01/02/19 2126 01/07/19 0959   01/02/19 2200  remdesivir 200 mg in sodium chloride 0.9% 250 mL IVPB     200 mg 580 mL/hr over 30 Minutes  Intravenous Once 01/02/19 2126 01/03/19 2130         Medications  Scheduled Meds: . vitamin C  500 mg Oral BID  . aspirin EC  81 mg Oral Daily  . benzonatate  100 mg Oral TID  . clopidogrel  75 mg Oral Daily  . dexamethasone (DECADRON) injection  6 mg Intravenous QHS  . enoxaparin (LOVENOX) injection  40 mg Subcutaneous QHS  . fluticasone  1 spray Each Nare Daily  . guaiFENesin  600 mg Oral BID  . insulin aspart  0-5 Units Subcutaneous QHS  . insulin aspart  0-9 Units Subcutaneous TID WC  . metoprolol tartrate  25 mg Oral BID  . zinc sulfate  220 mg Oral Daily   Continuous Infusions: . remdesivir 100 mg in NS 100 mL 100 mg (01/04/19 0850)   PRN Meds:.acetaminophen      Subjective:   Deveion Denz was seen and examined today.  States no acute issues overnight.  Appears to be mildly improving.  No acute chest pain or shortness of breath.  No fevers or chills.  Not on O2 this morning  Objective:   Vitals:   01/03/19 0915 01/03/19 1207 01/03/19 2109 01/04/19 0553  BP:  138/86 129/71 126/62  Pulse:  85 72 68  Resp:  '14 18 18  ' Temp: (!) 97.5 F (36.4 C) 98.4 F (36.9 C) 98.8 F (37.1 C) 98.1 F (36.7 C)  TempSrc: Oral Oral Oral Oral  SpO2:  96% 96% 97%  Weight:      Height:        Intake/Output Summary (Last 24 hours) at 01/04/2019 1506 Last data filed at 01/04/2019 0915 Gross per 24 hour  Intake 640 ml  Output 0 ml  Net 640 ml     Wt Readings from Last 3 Encounters:  01/02/19 96.8 kg    Physical Exam  General: Alert and oriented x 3, NAD  Eyes:   HEENT:  Atraumatic, normocephalic  Cardiovascular: S1 S2 clear, RRR  Respiratory: CTAB, no wheezing, rales or rhonchi  Gastrointestinal: Soft, nontender, nondistended, NBS  Ext: no pedal edema bilaterally  Neuro: no new deficits  Musculoskeletal: No cyanosis, clubbing  Skin: No rashes  Psych: Normal affect and demeanor, alert and oriented x3      Data Reviewed:  I have personally  reviewed following labs and imaging studies  Micro Results Recent Results (from the past 240 hour(s))  SARS Coronavirus 2 Ag (30 min TAT) - Nasopharyngeal Swab     Status: Abnormal   Collection Time:  01/02/19 12:37 PM   Specimen: Nasopharyngeal Swab; Nasal Swab  Result Value Ref Range Status   SARS Coronavirus 2 Ag POSITIVE (A) NEGATIVE Final    Comment: RESULT CALLED TO, READ BACK BY AND VERIFIED WITH: MARVA SIMMS RN '@[id number]'  01/02/2019 OLSONM (NOTE) SARS-CoV-2 antigen PRESENT. Positive results indicate the presence of viral antigens, but clinical correlation with patient history and other diagnostic information is necessary to determine patient infection status.  Positive results do not rule out bacterial infection or co-infection  with other viruses. False positive results are rare but can occur, and confirmatory RT-PCR testing may be appropriate in some circumstances. The expected result is Negative. Fact Sheet for Patients: PodPark.tn Fact Sheet for Providers: GiftContent.is  This test is not yet approved or cleared by the [place] FDA and  has been authorized for detection and/or diagnosis of SARS-CoV-2 by FDA under an Emergency Use Authorization (EUA).  This EUA will remain in effect (meaning this test can be used) for the duration of  the COVID-19 decl aration under Section 564(b)(1) of the Act, 21 U.S.C. section 360bbb-3(b)(1), unless the authorization is terminated or revoked sooner. Performed at [facility], [address] [place], [place] [address]   Blood Culture (routine x 2)     Status: None (Preliminary result)   Collection Time: 01/02/19 12:47 PM   Specimen: BLOOD  Result Value Ref Range Status   Specimen Description   Final    BLOOD RIGHT ANTECUBITAL Performed at [facility], [address] [place], [place] [address]    Special Requests   Final    BOTTLES DRAWN AEROBIC AND  ANAEROBIC Blood Culture adequate volume Performed at [facility], [address] [place], [place] [address]    Culture   Final    NO GROWTH 2 DAYS Performed at [facility], [place] [address] [place], [place] [address]    Report Status PENDING  Incomplete  Blood Culture (routine x 2)     Status: None (Preliminary result)   Collection Time: 01/02/19 12:57 PM   Specimen: BLOOD  Result Value Ref Range Status   Specimen Description   Final    BLOOD LEFT ANTECUBITAL Performed at [facility], [address] [place], [place] [address]    Special Requests   Final    BOTTLES DRAWN AEROBIC AND ANAEROBIC Blood Culture adequate volume Performed at [facility], [address] [place], [place] [address]    Culture   Final    NO GROWTH 2 DAYS Performed at [facility], [place] [address], [place], [place] [address]    Report Status PENDING  Incomplete  SARS CORONAVIRUS 2 (TAT 6-24 HRS) Nasopharyngeal Nasopharyngeal Swab     Status: Abnormal   Collection Time: 01/02/19  1:04 PM   Specimen: Nasopharyngeal Swab  Result Value Ref Range Status   SARS Coronavirus 2 POSITIVE (A) NEGATIVE Final    Comment: RESULT CALLED TO, READ BACK BY AND VERIFIED WITH: B.PAUDEL,RN [id number] I.MANNING Performed at [facility], [place] [address] [place], [place] [address]     Radiology Reports NM Pulmonary Perfusion  Result Date: 01/03/2019 CLINICAL DATA:  Positive D-dimer EXAM: NUCLEAR MEDICINE PERFUSION LUNG SCAN TECHNIQUE: Perfusion images were obtained in multiple projections after intravenous injection of radiopharmaceutical. Ventilation scans intentionally deferred if perfusion scan and chest x-ray adequate for interpretation during COVID 19 epidemic. RADIOPHARMACEUTICALS:  1.47 mCi Tc-107mMAA IV  COMPARISON:  Chest x-ray 02/01/2018 FINDINGS: No perfusion defects seen to suggest pulmonary embolus. IMPRESSION: No evidence of pulmonary embolus. Electronically  Signed   By: Rolm Baptise M.D.   On: 01/03/2019 11:50   US RENAL  Result Date: 01/03/2019 CLINICAL DATA:  Acute renal failure EXAM: RENAL / URINARY TRACT ULTRASOUND COMPLETE COMPARISON:  None. FINDINGS: Right Kidney: Renal measurements: 11.4 x 5.3 x 5.6 = volume: 175.2 mL . Echogenicity within normal limits. No mass or hydronephrosis visualized. Left Kidney: Renal measurements: 11.3 x 6.1 x 5.6 = volume: 200.8 mL. Echogenicity within normal limits. No mass or hydronephrosis visualized. Bladder: Appears normal for degree of bladder distention. Other: Diffusely increased hepatic echogenicity with loss of definition of the portal triads and diminished posterior through transmission compatible with hepatic steatosis. Enlarged prostate is incidentally noted as well measuring 6.2 x 4.6 x 5.4 cm for a volume of 82 mL. IMPRESSION: Unremarkable renal ultrasound. Prostatomegaly. Diffusely increased hepatic echogenicity, most commonly seen with hepatic steatosis. Electronically Signed   By: Lovena Le M.D.   On: 01/03/2019 01:02   DG Chest Port 1 View  Result Date: 01/03/2019 CLINICAL DATA:  Dyspnea, tachycardia, nasal congestion, cough, diarrhea, and headache. COVID-19 positive earlier this month. EXAM: PORTABLE CHEST 1 VIEW COMPARISON:  01/02/2019 FINDINGS: The cardiac silhouette is borderline enlarged. No airspace consolidation, edema, pleural effusion, pneumothorax is identified. No acute osseous abnormality is seen. IMPRESSION: No active disease. Electronically Signed   By: Logan Bores M.D.   On: 01/03/2019 12:34   DG Chest Portable 1 View  Result Date: 01/02/2019 CLINICAL DATA:  Dyspnea, cough, headache EXAM: PORTABLE CHEST 1 VIEW COMPARISON:  None. FINDINGS: Normal heart size. Normal mediastinal contour. No pneumothorax. No pleural effusion. Lungs appear clear, with no acute consolidative airspace disease and no pulmonary edema. IMPRESSION: No active disease. Electronically Signed   By: Ilona Sorrel  M.D.   On: 01/02/2019 09:12   ECHOCARDIOGRAM COMPLETE  Result Date: 01/03/2019   ECHOCARDIOGRAM REPORT   Patient Name:   PATRICE MOATES Date of Exam: 01/03/2019 Medical Rec #:  [id number]    Height:       74.0 in Accession #:    [id number]   Weight:       213.4 lb Date of Birth:  Sep 19, 1965    BSA:          2.23 m Patient Age:    39 years     BP:           138/86 mmHg Patient Gender: M            HR:           80 bpm. Exam Location:  Inpatient Procedure: 2D Echo, Color Doppler and Cardiac Doppler Indications:    R00.0 Tachycardia  History:        Patient has no prior history of Echocardiogram examinations.                 CHF, CAD; Risk Factors:Diabetes. COVID+ on 01/02/19.  Sonographer:    Raquel Sarna Senior RDCS Referring Phys: [place]  1. Left ventricular ejection fraction, by visual estimation, is 30 to 35%. The left ventricle has moderate to severely decreased function. There is moderately increased left ventricular hypertrophy.  2. Left ventricular diastolic parameters are consistent with Grade I diastolic dysfunction (impaired relaxation).  3. The left ventricle demonstrates global hypokinesis.  4. Global right ventricle has normal systolic function.The right ventricular size is normal.  5. Left atrial size  was normal.  6. Right atrial size was normal.  7. Trivial pericardial effusion is present.  8. The mitral valve is normal in structure. No evidence of mitral valve regurgitation. No evidence of mitral stenosis.  9. The tricuspid valve is normal in structure. 10. The aortic valve is tricuspid. Aortic valve regurgitation is not visualized. No evidence of aortic valve sclerosis or stenosis. 11. The pulmonic valve was normal in structure. Pulmonic valve regurgitation is not visualized. 12. The inferior vena cava is normal in size with greater than 50% respiratory variability, suggesting right atrial pressure of 3 mmHg. 13. Moderate to severe global reduction in LV systolic function; grade 1  diastolic dysfunction; moderate LVH. FINDINGS  Left Ventricle: Left ventricular ejection fraction, by visual estimation, is 30 to 35%. The left ventricle has moderate to severely decreased function. The left ventricle demonstrates global hypokinesis. There is moderately increased left ventricular hypertrophy. Left ventricular diastolic parameters are consistent with Grade I diastolic dysfunction (impaired relaxation). Normal left atrial pressure. Right Ventricle: The right ventricular size is normal.Global RV systolic function is has normal systolic function. Left Atrium: Left atrial size was normal in size. Right Atrium: Right atrial size was normal in size Pericardium: Trivial pericardial effusion is present. Mitral Valve: The mitral valve is normal in structure. No evidence of mitral valve regurgitation. No evidence of mitral valve stenosis by observation. Tricuspid Valve: The tricuspid valve is normal in structure. Tricuspid valve regurgitation is trivial. Aortic Valve: The aortic valve is tricuspid. Aortic valve regurgitation is not visualized. The aortic valve is structurally normal, with no evidence of sclerosis or stenosis. Pulmonic Valve: The pulmonic valve was normal in structure. Pulmonic valve regurgitation is not visualized. Pulmonic regurgitation is not visualized. Aorta: The aortic root is normal in size and structure. Venous: The inferior vena cava is normal in size with greater than 50% respiratory variability, suggesting right atrial pressure of 3 mmHg.  Additional Comments: Moderate to severe global reduction in LV systolic function; grade 1 diastolic dysfunction; moderate LVH.  LEFT VENTRICLE PLAX 2D LVIDd:         4.00 cm  Diastology LVIDs:         2.60 cm  LV e' lateral:   6.96 cm/s LV PW:         1.20 cm  LV E/e' lateral: 6.0 LV IVS:        1.40 cm  LV e' medial:    5.44 cm/s LVOT diam:     2.30 cm  LV E/e' medial:  7.7 LV SV:         45 ml LV SV Index:   20.07 LVOT Area:     4.15 cm  RIGHT  VENTRICLE RV S prime:     7.51 cm/s TAPSE (M-mode): 1.8 cm LEFT ATRIUM             Index       RIGHT ATRIUM           Index LA diam:        3.60 cm 1.61 cm/m  RA Area:     12.50 cm LA Vol (A2C):   41.0 ml 18.35 ml/m RA Volume:   26.10 ml  11.68 ml/m LA Vol (A4C):   37.1 ml 16.61 ml/m LA Biplane Vol: 42.4 ml 18.98 ml/m  AORTIC VALVE LVOT Vmax:   58.10 cm/s LVOT Vmean:  45.000 cm/s LVOT VTI:    0.113 m  AORTA Ao Root diam: 3.20 cm Ao Asc diam:  3.30 cm  MITRAL VALVE MV Area (PHT): 1.84 cm             SHUNTS MV PHT:        119.48 msec          Systemic VTI:  0.11 m MV Decel Time: 412 msec             Systemic Diam: 2.30 cm MV E velocity: 42.00 cm/s 103 cm/s MV A velocity: 54.00 cm/s 70.3 cm/s MV E/A ratio:  0.78       1.5  Kirk Ruths MD Electronically signed by Kirk Ruths MD Signature Date/Time: 01/03/2019/2:34:36 PM    Final     Lab Data:  CBC: Recent Labs  Lab 01/02/19 0957 01/03/19 0316 01/04/19 0307  WBC 9.8 8.0 5.1  NEUTROABS 7.7 6.7 3.7  HGB 15.9 14.5 12.5*  HCT 49.1 46.5 40.3  MCV 87.1 88.6 88.8  PLT 241 223 786   Basic Metabolic Panel: Recent Labs  Lab 01/02/19 0957 01/03/19 0316 01/04/19 0307  NA 136 134* 136  K 3.7 4.8 4.7  CL 97* 104 105  CO2 27 18* 19*  GLUCOSE 103* 209* 161*  BUN 40* 39* 48*  CREATININE 2.28* 2.26* 1.77*  CALCIUM 9.4 9.0 8.7*   GFR: Estimated Creatinine Clearance: 54.8 mL/min (A) (by C-G formula based on SCr of 1.77 mg/dL (H)). Liver Function Tests: Recent Labs  Lab 01/02/19 0957 01/03/19 0316 01/04/19 0307  AST '23 22 16  ' ALT '26 26 21  ' ALKPHOS 58 46 41  BILITOT 0.8 0.3 0.5  PROT 8.2* 7.6 6.8  ALBUMIN 4.1 3.8 3.2*   No results for input(s): LIPASE, AMYLASE in the last 168 hours. No results for input(s): AMMONIA in the last 168 hours. Coagulation Profile: No results for input(s): INR, PROTIME in the last 168 hours. Cardiac Enzymes: No results for input(s): CKTOTAL, CKMB, CKMBINDEX, TROPONINI in the last 168 hours. BNP (last  3 results) No results for input(s): PROBNP in the last 8760 hours. HbA1C: Recent Labs    01/03/19 0316  HGBA1C 7.5*   CBG: Recent Labs  Lab 01/03/19 1205 01/03/19 1651 01/03/19 2106 01/04/19 0758 01/04/19 1213  GLUCAP 131* 156* 122* 137* 119*   Lipid Profile: Recent Labs    01/02/19 1237  TRIG 308*   Thyroid Function Tests: Recent Labs    01/03/19 0316  TSH 0.590   Anemia Panel: Recent Labs    01/03/19 0847 01/04/19 0307  FERRITIN 256 313   Urine analysis:    Component Value Date/Time   COLORURINE YELLOW 01/02/2019 0957   APPEARANCEUR CLEAR 01/02/2019 0957   LABSPEC 1.020 01/02/2019 0957   PHURINE 6.0 01/02/2019 0957   GLUCOSEU >=500 (A) 01/02/2019 0957   HGBUR TRACE (A) 01/02/2019 0957   BILIRUBINUR NEGATIVE 01/02/2019 0957   KETONESUR NEGATIVE 01/02/2019 0957   PROTEINUR NEGATIVE 01/02/2019 0957   NITRITE NEGATIVE 01/02/2019 0957   LEUKOCYTESUR NEGATIVE 01/02/2019 0957     Hersh Minney M.D. [facility] 01/04/2019, 3:06 PM   Call night coverage person covering after 7pm

## 2019-01-05 DIAGNOSIS — E0800 Diabetes mellitus due to underlying condition with hyperosmolarity without nonketotic hyperglycemic-hyperosmolar coma (NKHHC): Secondary | ICD-10-CM

## 2019-01-05 LAB — COMPREHENSIVE METABOLIC PANEL
ALT: 18 U/L (ref 0–44)
AST: 26 U/L (ref 15–41)
Albumin: 3.6 g/dL (ref 3.5–5.0)
Alkaline Phosphatase: 39 U/L (ref 38–126)
Anion gap: 11 (ref 5–15)
BUN: 49 mg/dL — ABNORMAL HIGH (ref 6–20)
CO2: 18 mmol/L — ABNORMAL LOW (ref 22–32)
Calcium: 9 mg/dL (ref 8.9–10.3)
Chloride: 105 mmol/L (ref 98–111)
Creatinine, Ser: 1.77 mg/dL — ABNORMAL HIGH (ref 0.61–1.24)
GFR calc Af Amer: 49 mL/min — ABNORMAL LOW (ref >60)
GFR calc non Af Amer: 42 mL/min — ABNORMAL LOW (ref >60)
Glucose, Bld: 159 mg/dL — ABNORMAL HIGH (ref 70–99)
Potassium: 5.6 mmol/L — ABNORMAL HIGH (ref 3.5–5.1)
Sodium: 134 mmol/L — ABNORMAL LOW (ref 135–145)
Total Bilirubin: 1 mg/dL (ref 0.3–1.2)
Total Protein: 7.4 g/dL (ref 6.5–8.1)

## 2019-01-05 LAB — GLUCOSE, CAPILLARY
Glucose-Capillary: 136 mg/dL — ABNORMAL HIGH (ref 70–99)
Glucose-Capillary: 117 mg/dL — ABNORMAL HIGH (ref 70–99)
Glucose-Capillary: 145 mg/dL — ABNORMAL HIGH (ref 70–99)
Glucose-Capillary: 183 mg/dL — ABNORMAL HIGH (ref 70–99)

## 2019-01-05 LAB — CBC WITH DIFFERENTIAL/PLATELET
Abs Immature Granulocytes: 0.09 10*3/uL — ABNORMAL HIGH (ref 0.00–0.07)
Basophils Absolute: 0 10*3/uL (ref 0.0–0.1)
Basophils Relative: 0 %
Eosinophils Absolute: 0 10*3/uL (ref 0.0–0.5)
Eosinophils Relative: 0 %
HCT: 43.4 % (ref 39.0–52.0)
Hemoglobin: 13.4 g/dL (ref 13.0–17.0)
Immature Granulocytes: 2 %
Lymphocytes Relative: 18 %
Lymphs Abs: 0.9 10*3/uL (ref 0.7–4.0)
MCH: 27.7 pg (ref 26.0–34.0)
MCHC: 30.9 g/dL (ref 30.0–36.0)
MCV: 89.7 fL (ref 80.0–100.0)
Monocytes Absolute: 0.2 10*3/uL (ref 0.1–1.0)
Monocytes Relative: 4 %
Neutro Abs: 3.8 10*3/uL (ref 1.7–7.7)
Neutrophils Relative %: 76 %
Platelets: 167 10*3/uL (ref 150–400)
RBC: 4.84 MIL/uL (ref 4.22–5.81)
RDW: 14.8 % (ref 11.5–15.5)
WBC: 5 10*3/uL (ref 4.0–10.5)
nRBC: 0 % (ref 0.0–0.2)

## 2019-01-05 LAB — FIBRINOGEN: Fibrinogen: 637 mg/dL — ABNORMAL HIGH (ref 210–475)

## 2019-01-05 LAB — D-DIMER, QUANTITATIVE: D-Dimer, Quant: 0.69 ug/mL-FEU — ABNORMAL HIGH (ref 0.00–0.50)

## 2019-01-05 LAB — FERRITIN: Ferritin: 354 ng/mL — ABNORMAL HIGH (ref 24–336)

## 2019-01-05 LAB — LACTATE DEHYDROGENASE: LDH: 242 U/L — ABNORMAL HIGH (ref 98–192)

## 2019-01-05 LAB — C-REACTIVE PROTEIN: CRP: 2.5 mg/dL — ABNORMAL HIGH (ref <1.0)

## 2019-01-05 NOTE — Plan of Care (Signed)
  Problem: Education: Goal: Knowledge of risk factors and measures for prevention of condition will improve Outcome: Progressing   Problem: Coping: Goal: Psychosocial and spiritual needs will be supported Outcome: Progressing   Problem: Respiratory: Goal: Will maintain a patent airway Outcome: Progressing   Problem: Clinical Measurements: Goal: Will remain free from infection Outcome: Progressing   Problem: Clinical Measurements: Goal: Diagnostic test results will improve Outcome: Progressing   Problem: Clinical Measurements: Goal: Respiratory complications will improve Outcome: Progressing

## 2019-01-05 NOTE — Progress Notes (Signed)
[facility]                                                                              Patient Demographics  Oscar Mack, is a 53 y.o. male, DOB - [birth year], BEE:[id number]  Admit date - 01/02/2019   Admitting Physician Jani Gravel, MD  Outpatient Primary MD for the patient is Patient, No Pcp Per  Outpatient specialists:   LOS - 3  days   Medical records reviewed and are as summarized below:    Chief Complaint  Patient presents with  . Shortness of Breath       Brief summary   Patient is a 53 year old male visiting from [place], history of diabetes mellitus 2, CAD status post stentx 2, CHF (systolic), presented with dyspnea, tachycardia, nasal congestion, cough, diarrhea, intermittent headaches.  Fevers, 102 F, myalgias.  In ED, temp was 99.5 F.  Chest x-ray normal. Creatinine 2.28 procalcitonin less than 0.1  COVID-19 test positive   Assessment & Plan   Principal problem  1. Acute Hypoxic Resp. Failure due to Acute Covid 19 Viral Pneumonia during the ongoing 2020 Covid 19 Pandemic - POA, sepsis - Patient met sepsis criteria secondary to fevers, tachycardia, acute renal insufficiency, likely due to COVID-19 viral pneumonitis.   Chest x-ray showed no acute pneumonia -  Has known sick contact in family members  -Continue IV dexamethasone, remdesivir per pharmacy protocol, day #4 today   -   continue Supportive care:  Vitamin C/zinc, albuterol,  Tylenol. - Continue to wean oxygen, ambulatory O2 screening daily as tolerated  - Oxygen - SpO2: 98 %.  Currently on room air, improving  - VQ scan showed no PE -Continue to follow labs, inflammatory markers improving  Lab Results  Component Value Date   SARSCOV2NAA POSITIVE (A) 01/02/2019     Recent Labs  Lab 01/02/19 0957 01/02/19 1237 01/03/19 0316 01/03/19 0847 01/04/19 0307 01/05/19 0357  DDIMER  --  0.75*  --  0.50 0.64* 0.69*  FERRITIN  --  281  --  256 313 354*  CRP  --  3.7*  --  11.2* 6.9*  2.5*  ALT 26  --  26  --  21 18  PROCALCITON  --  <0.10  --   --   --   --       Active Problems:     ARF (acute renal failure) (HCC) -Likely has underlying CKD, no baseline creatinine available in records.  Patient visiting from [place].  Reports that he does not remember being told about CKD. -Continue to hold Metformin, Entresto -Renal ultrasound negative for any obstruction or hydronephrosis -Possibly prerenal secondary to multiple episodes of diarrhea, dehydration and poor p.o. intake -Creatinine function plateaued at 1.7, likely has underlying CKD     CAD (coronary artery disease),  Chronic systolic and diastolic CHF (congestive heart failure) (HCC) -Currently no chest pain.  Currently euvolemic -2D echo showed EF of 30 to 35% with moderate to severe decreased left ventricular function, increased LVH, grade 1 diastolic dysfunction -Continue aspirin, Plavix, beta-blocker  Diabetes mellitus type 2, NIDDM -Hold Farxiga, Glucophage, Ozempic -Continue sliding scale insulin -Hemoglobin A1c  7.5   Code Status: Full CODE STATUS DVT Prophylaxis:  Lovenox  Family Communication: Discussed all imaging results, lab results, explained to the patient   Disposition Plan: Pending clinical status, currently inpatient given acute hypoxic respiratory failure, acute COVID-19 illness.  Once ambulatory without overt symptoms or hypoxia would consider discharge home.  Likely DC home in a.m. after he finishes remdesivir.    Time Spent in minutes 25 minutes  Procedures:  None  Consultants:   None  Antimicrobials:   Anti-infectives (From admission, onward)   Start     Dose/Rate Route Frequency Ordered Stop   01/03/19 1600  remdesivir 100 mg in sodium chloride 0.9 % 100 mL IVPB     100 mg 200 mL/hr over 30 Minutes Intravenous Daily 01/02/19 2126 01/07/19 0959   01/02/19 2200  remdesivir 200 mg in sodium chloride 0.9% 250 mL IVPB     200 mg 580 mL/hr over 30 Minutes Intravenous Once 01/02/19  2126 01/03/19 2130         Medications  Scheduled Meds: . vitamin C  500 mg Oral BID  . aspirin EC  81 mg Oral Daily  . benzonatate  100 mg Oral TID  . cetirizine  10 mg Oral Daily   And  . pseudoephedrine  120 mg Oral BID  . clopidogrel  75 mg Oral Daily  . dexamethasone (DECADRON) injection  6 mg Intravenous QHS  . enoxaparin (LOVENOX) injection  40 mg Subcutaneous QHS  . fluticasone  1 spray Each Nare Daily  . guaiFENesin  600 mg Oral BID  . insulin aspart  0-5 Units Subcutaneous QHS  . insulin aspart  0-9 Units Subcutaneous TID WC  . metoprolol tartrate  25 mg Oral BID  . zinc sulfate  220 mg Oral Daily   Continuous Infusions: . remdesivir 100 mg in NS 100 mL 100 mg (01/05/19 1001)   PRN Meds:.acetaminophen      Subjective:   Oscar Mack was seen and examined today.  No acute complaints.  Overnight no fevers chills, shortness of breath or chest pain.  No hypoxia.  Objective:   Vitals:   01/04/19 2109 01/05/19 0414 01/05/19 0955 01/05/19 1220  BP: 133/83 122/71 114/78 (!) 123/103  Pulse: 67 66 76 77  Resp: '20 18 18 18  ' Temp: 98 F (36.7 C) 97.9 F (36.6 C) 98.7 F (37.1 C) 97.8 F (36.6 C)  TempSrc: Oral Oral Oral Oral  SpO2: 97% 95% 96% 98%  Weight:      Height:        Intake/Output Summary (Last 24 hours) at 01/05/2019 1358 Last data filed at 01/05/2019 1001 Gross per 24 hour  Intake 680 ml  Output 3 ml  Net 677 ml     Wt Readings from Last 3 Encounters:  01/02/19 96.8 kg   Physical Exam  General: Alert and oriented x 3, NAD  Eyes  HEENT:  Atraumatic, normocephalic  Cardiovascular: S1 S2 clear, RRR. No pedal edema b/l  Respiratory: Diminished breath sound at the bases, no wheezing  Gastrointestinal: Soft, nontender, nondistended, NBS  Ext: no pedal edema bilaterally  Neuro: no new deficits  Musculoskeletal: No cyanosis, clubbing  Skin: No rashes  Psych: Normal affect and demeanor, alert and oriented x3    Data  Reviewed:  I have personally reviewed following labs and imaging studies  Micro Results Recent Results (from the past 240 hour(s))  SARS Coronavirus 2 Ag (30 min TAT) - Nasopharyngeal Swab     Status: Abnormal  Collection Time: 01/02/19 12:37 PM   Specimen: Nasopharyngeal Swab; Nasal Swab  Result Value Ref Range Status   SARS Coronavirus 2 Ag POSITIVE (A) NEGATIVE Final    Comment: RESULT CALLED TO, READ BACK BY AND VERIFIED WITH: MARVA SIMMS RN '@[id number]'  01/02/2019 OLSONM (NOTE) SARS-CoV-2 antigen PRESENT. Positive results indicate the presence of viral antigens, but clinical correlation with patient history and other diagnostic information is necessary to determine patient infection status.  Positive results do not rule out bacterial infection or co-infection  with other viruses. False positive results are rare but can occur, and confirmatory RT-PCR testing may be appropriate in some circumstances. The expected result is Negative. Fact Sheet for Patients: PodPark.tn Fact Sheet for Providers: GiftContent.is  This test is not yet approved or cleared by the [place] FDA and  has been authorized for detection and/or diagnosis of SARS-CoV-2 by FDA under an Emergency Use Authorization (EUA).  This EUA will remain in effect (meaning this test can be used) for the duration of  the COVID-19 decl aration under Section 564(b)(1) of the Act, 21 U.S.C. section 360bbb-3(b)(1), unless the authorization is terminated or revoked sooner. Performed at [facility], [address] [place], [place] [address]   Blood Culture (routine x 2)     Status: None (Preliminary result)   Collection Time: 01/02/19 12:47 PM   Specimen: BLOOD  Result Value Ref Range Status   Specimen Description   Final    BLOOD RIGHT ANTECUBITAL Performed at [facility], [address] [place], [place] [address]    Special Requests   Final      BOTTLES DRAWN AEROBIC AND ANAEROBIC Blood Culture adequate volume Performed at [facility], [address] [place], [place] [address]    Culture   Final    NO GROWTH 3 DAYS Performed at [facility], [place] [address] [place], [place] [address]    Report Status PENDING  Incomplete  Blood Culture (routine x 2)     Status: None (Preliminary result)   Collection Time: 01/02/19 12:57 PM   Specimen: BLOOD  Result Value Ref Range Status   Specimen Description   Final    BLOOD LEFT ANTECUBITAL Performed at [facility], [address] [place], [place] [address]    Special Requests   Final    BOTTLES DRAWN AEROBIC AND ANAEROBIC Blood Culture adequate volume Performed at [facility], [address] [place], [place] [address]    Culture   Final    NO GROWTH 3 DAYS Performed at [facility], [place] [address], [place], [place] [address]    Report Status PENDING  Incomplete  SARS CORONAVIRUS 2 (TAT 6-24 HRS) Nasopharyngeal Nasopharyngeal Swab     Status: Abnormal   Collection Time: 01/02/19  1:04 PM   Specimen: Nasopharyngeal Swab  Result Value Ref Range Status   SARS Coronavirus 2 POSITIVE (A) NEGATIVE Final    Comment: RESULT CALLED TO, READ BACK BY AND VERIFIED WITH: B.PAUDEL,RN [id number] I.MANNING Performed at [facility], [place] [address] [place], [place] [address]     Radiology Reports NM Pulmonary Perfusion  Result Date: 01/03/2019 CLINICAL DATA:  Positive D-dimer EXAM: NUCLEAR MEDICINE PERFUSION LUNG SCAN TECHNIQUE: Perfusion images were obtained in multiple projections after intravenous injection of radiopharmaceutical. Ventilation scans intentionally deferred if perfusion scan and chest x-ray adequate for interpretation during COVID 19 epidemic. RADIOPHARMACEUTICALS:  1.47 mCi  Tc-3mMAA IV COMPARISON:  Chest x-ray 02/01/2018 FINDINGS: No perfusion defects seen to suggest pulmonary embolus. IMPRESSION: No evidence of  pulmonary embolus. Electronically Signed   By: KRolm BaptiseM.D.   On: 01/03/2019 11:50   [place]RENAL  Result Date: 01/03/2019 CLINICAL DATA:  Acute renal failure EXAM: RENAL / URINARY TRACT ULTRASOUND COMPLETE COMPARISON:  None. FINDINGS: Right Kidney: Renal measurements: 11.4 x 5.3 x 5.6 = volume: 175.2 mL . Echogenicity within normal limits. No mass or hydronephrosis visualized. Left Kidney: Renal measurements: 11.3 x 6.1 x 5.6 = volume: 200.8 mL. Echogenicity within normal limits. No mass or hydronephrosis visualized. Bladder: Appears normal for degree of bladder distention. Other: Diffusely increased hepatic echogenicity with loss of definition of the portal triads and diminished posterior through transmission compatible with hepatic steatosis. Enlarged prostate is incidentally noted as well measuring 6.2 x 4.6 x 5.4 cm for a volume of 82 mL. IMPRESSION: Unremarkable renal ultrasound. Prostatomegaly. Diffusely increased hepatic echogenicity, most commonly seen with hepatic steatosis. Electronically Signed   By: PLovena LeM.D.   On: 01/03/2019 01:02   DG Chest Port 1 View  Result Date: 01/03/2019 CLINICAL DATA:  Dyspnea, tachycardia, nasal congestion, cough, diarrhea, and headache. COVID-19 positive earlier this month. EXAM: PORTABLE CHEST 1 VIEW COMPARISON:  01/02/2019 FINDINGS: The cardiac silhouette is borderline enlarged. No airspace consolidation, edema, pleural effusion, pneumothorax is identified. No acute osseous abnormality is seen. IMPRESSION: No active disease. Electronically Signed   By: ALogan BoresM.D.   On: 01/03/2019 12:34   DG Chest Portable 1 View  Result Date: 01/02/2019 CLINICAL DATA:  Dyspnea, cough, headache EXAM: PORTABLE CHEST 1 VIEW COMPARISON:  None. FINDINGS: Normal heart size. Normal mediastinal contour. No pneumothorax. No pleural effusion. Lungs appear clear, with no acute consolidative airspace disease and no pulmonary edema. IMPRESSION: No active disease.  Electronically Signed   By: JIlona SorrelM.D.   On: 01/02/2019 09:12   ECHOCARDIOGRAM COMPLETE  Result Date: 01/03/2019   ECHOCARDIOGRAM REPORT   Patient Name:   Oscar ROCKHILLDate of Exam: 01/03/2019 Medical Rec #:  [id number]   Height:       74.0 in Accession #:    [id number]  Weight:       213.4 lb Date of Birth:  [birth year]   BSA:          2.23 m Patient Age:    54years     BP:           138/86 mmHg Patient Gender: M            HR:           80 bpm. Exam Location:  Inpatient Procedure: 2D Echo, Color Doppler and Cardiac Doppler Indications:    R00.0 Tachycardia  History:        Patient has no prior history of Echocardiogram examinations.                 CHF, CAD; Risk Factors:Diabetes. COVID+ on 01/02/19.  Sonographer:    ERaquel SarnaSenior RDCS Referring Phys: [email or web address] 1. Left ventricular ejection fraction, by visual estimation, is 30 to 35%. The left ventricle has moderate to severely decreased function. There is moderately increased left ventricular hypertrophy.  2. Left ventricular diastolic parameters are consistent with Grade I diastolic dysfunction (impaired relaxation).  3. The left ventricle demonstrates global hypokinesis.  4. Global right ventricle has normal systolic function.The right ventricular size is normal.  5.  Left atrial size was normal.  6. Right atrial size was normal.  7. Trivial pericardial effusion is present.  8. The mitral valve is normal in structure. No evidence of mitral valve regurgitation. No evidence of mitral stenosis.  9. The tricuspid valve is normal in structure. 10. The aortic valve is tricuspid. Aortic valve regurgitation is not visualized. No evidence of aortic valve sclerosis or stenosis. 11. The pulmonic valve was normal in structure. Pulmonic valve regurgitation is not visualized. 12. The inferior vena cava is normal in size with greater than 50% respiratory variability, suggesting right atrial pressure of 3 mmHg. 13. Moderate to severe global  reduction in LV systolic function; grade 1 diastolic dysfunction; moderate LVH. FINDINGS  Left Ventricle: Left ventricular ejection fraction, by visual estimation, is 30 to 35%. The left ventricle has moderate to severely decreased function. The left ventricle demonstrates global hypokinesis. There is moderately increased left ventricular hypertrophy. Left ventricular diastolic parameters are consistent with Grade I diastolic dysfunction (impaired relaxation). Normal left atrial pressure. Right Ventricle: The right ventricular size is normal.Global RV systolic function is has normal systolic function. Left Atrium: Left atrial size was normal in size. Right Atrium: Right atrial size was normal in size Pericardium: Trivial pericardial effusion is present. Mitral Valve: The mitral valve is normal in structure. No evidence of mitral valve regurgitation. No evidence of mitral valve stenosis by observation. Tricuspid Valve: The tricuspid valve is normal in structure. Tricuspid valve regurgitation is trivial. Aortic Valve: The aortic valve is tricuspid. Aortic valve regurgitation is not visualized. The aortic valve is structurally normal, with no evidence of sclerosis or stenosis. Pulmonic Valve: The pulmonic valve was normal in structure. Pulmonic valve regurgitation is not visualized. Pulmonic regurgitation is not visualized. Aorta: The aortic root is normal in size and structure. Venous: The inferior vena cava is normal in size with greater than 50% respiratory variability, suggesting right atrial pressure of 3 mmHg.  Additional Comments: Moderate to severe global reduction in LV systolic function; grade 1 diastolic dysfunction; moderate LVH.  LEFT VENTRICLE PLAX 2D LVIDd:         4.00 cm  Diastology LVIDs:         2.60 cm  LV e' lateral:   6.96 cm/s LV PW:         1.20 cm  LV E/e' lateral: 6.0 LV IVS:        1.40 cm  LV e' medial:    5.44 cm/s LVOT diam:     2.30 cm  LV E/e' medial:  7.7 LV SV:         45 ml LV SV  Index:   20.07 LVOT Area:     4.15 cm  RIGHT VENTRICLE RV S prime:     7.51 cm/s TAPSE (M-mode): 1.8 cm LEFT ATRIUM             Index       RIGHT ATRIUM           Index LA diam:        3.60 cm 1.61 cm/m  RA Area:     12.50 cm LA Vol (A2C):   41.0 ml 18.35 ml/m RA Volume:   26.10 ml  11.68 ml/m LA Vol (A4C):   37.1 ml 16.61 ml/m LA Biplane Vol: 42.4 ml 18.98 ml/m  AORTIC VALVE LVOT Vmax:   58.10 cm/s LVOT Vmean:  45.000 cm/s LVOT VTI:    0.113 m  AORTA Ao Root diam: 3.20 cm Ao Asc diam:  3.30 cm MITRAL VALVE MV Area (PHT): 1.84 cm             SHUNTS MV PHT:        119.48 msec          Systemic VTI:  0.11 m MV Decel Time: 412 msec             Systemic Diam: 2.30 cm MV E velocity: 42.00 cm/s 103 cm/s MV A velocity: 54.00 cm/s 70.3 cm/s MV E/A ratio:  0.78       1.5  Kirk Ruths MD Electronically signed by Kirk Ruths MD Signature Date/Time: 01/03/2019/2:34:36 PM    Final     Lab Data:  CBC: Recent Labs  Lab 01/02/19 0957 01/03/19 0316 01/04/19 0307 01/05/19 0357  WBC 9.8 8.0 5.1 5.0  NEUTROABS 7.7 6.7 3.7 3.8  HGB 15.9 14.5 12.5* 13.4  HCT 49.1 46.5 40.3 43.4  MCV 87.1 88.6 88.8 89.7  PLT 241 223 195 480   Basic Metabolic Panel: Recent Labs  Lab 01/02/19 0957 01/03/19 0316 01/04/19 0307 01/05/19 0357  NA 136 134* 136 134*  K 3.7 4.8 4.7 5.6*  CL 97* 104 105 105  CO2 27 18* 19* 18*  GLUCOSE 103* 209* 161* 159*  BUN 40* 39* 48* 49*  CREATININE 2.28* 2.26* 1.77* 1.77*  CALCIUM 9.4 9.0 8.7* 9.0   GFR: Estimated Creatinine Clearance: 54.8 mL/min (A) (by C-G formula based on SCr of 1.77 mg/dL (H)). Liver Function Tests: Recent Labs  Lab 01/02/19 0957 01/03/19 0316 01/04/19 0307 01/05/19 0357  AST '23 22 16 26  ' ALT '26 26 21 18  ' ALKPHOS 58 46 41 39  BILITOT 0.8 0.3 0.5 1.0  PROT 8.2* 7.6 6.8 7.4  ALBUMIN 4.1 3.8 3.2* 3.6   No results for input(s): LIPASE, AMYLASE in the last 168 hours. No results for input(s): AMMONIA in the last 168 hours. Coagulation  Profile: No results for input(s): INR, PROTIME in the last 168 hours. Cardiac Enzymes: No results for input(s): CKTOTAL, CKMB, CKMBINDEX, TROPONINI in the last 168 hours. BNP (last 3 results) No results for input(s): PROBNP in the last 8760 hours. HbA1C: Recent Labs    01/03/19 0316  HGBA1C 7.5*   CBG: Recent Labs  Lab 01/04/19 1213 01/04/19 1620 01/04/19 2106 01/05/19 0806 01/05/19 1204  GLUCAP 119* 120* 147* 136* 117*   Lipid Profile: No results for input(s): CHOL, HDL, LDLCALC, TRIG, CHOLHDL, LDLDIRECT in the last 72 hours. Thyroid Function Tests: Recent Labs    01/03/19 0316  TSH 0.590   Anemia Panel: Recent Labs    01/04/19 0307 01/05/19 0357  FERRITIN 313 354*   Urine analysis:    Component Value Date/Time   COLORURINE YELLOW 01/02/2019 0957   APPEARANCEUR CLEAR 01/02/2019 0957   LABSPEC 1.020 01/02/2019 0957   PHURINE 6.0 01/02/2019 0957   GLUCOSEU >=500 (A) 01/02/2019 0957   HGBUR TRACE (A) 01/02/2019 0957   BILIRUBINUR NEGATIVE 01/02/2019 0957   KETONESUR NEGATIVE 01/02/2019 0957   PROTEINUR NEGATIVE 01/02/2019 0957   NITRITE NEGATIVE 01/02/2019 0957   LEUKOCYTESUR NEGATIVE 01/02/2019 0957     Debralee Braaksma M.D. [facility] 01/05/2019, 1:58 PM   Call night coverage person covering after 7pm

## 2019-01-06 LAB — CBC WITH DIFFERENTIAL/PLATELET
Abs Immature Granulocytes: 0.24 10*3/uL — ABNORMAL HIGH (ref 0.00–0.07)
Basophils Absolute: 0 10*3/uL (ref 0.0–0.1)
Basophils Relative: 0 %
Eosinophils Absolute: 0 10*3/uL (ref 0.0–0.5)
Eosinophils Relative: 0 %
HCT: 43.8 % (ref 39.0–52.0)
Hemoglobin: 13.8 g/dL (ref 13.0–17.0)
Immature Granulocytes: 3 %
Lymphocytes Relative: 14 %
Lymphs Abs: 1.2 10*3/uL (ref 0.7–4.0)
MCH: 27.6 pg (ref 26.0–34.0)
MCHC: 31.5 g/dL (ref 30.0–36.0)
MCV: 87.6 fL (ref 80.0–100.0)
Monocytes Absolute: 0.2 10*3/uL (ref 0.1–1.0)
Monocytes Relative: 3 %
Neutro Abs: 6.6 10*3/uL (ref 1.7–7.7)
Neutrophils Relative %: 80 %
Platelets: 261 10*3/uL (ref 150–400)
RBC: 5 MIL/uL (ref 4.22–5.81)
RDW: 14.8 % (ref 11.5–15.5)
WBC: 8.3 10*3/uL (ref 4.0–10.5)
nRBC: 0 % (ref 0.0–0.2)

## 2019-01-06 LAB — COMPREHENSIVE METABOLIC PANEL
ALT: 23 U/L (ref 0–44)
AST: 17 U/L (ref 15–41)
Albumin: 3.6 g/dL (ref 3.5–5.0)
Alkaline Phosphatase: 45 U/L (ref 38–126)
Anion gap: 12 (ref 5–15)
BUN: 50 mg/dL — ABNORMAL HIGH (ref 6–20)
CO2: 20 mmol/L — ABNORMAL LOW (ref 22–32)
Calcium: 9.3 mg/dL (ref 8.9–10.3)
Chloride: 101 mmol/L (ref 98–111)
Creatinine, Ser: 1.74 mg/dL — ABNORMAL HIGH (ref 0.61–1.24)
GFR calc Af Amer: 50 mL/min — ABNORMAL LOW (ref >60)
GFR calc non Af Amer: 43 mL/min — ABNORMAL LOW (ref >60)
Glucose, Bld: 202 mg/dL — ABNORMAL HIGH (ref 70–99)
Potassium: 5 mmol/L (ref 3.5–5.1)
Sodium: 133 mmol/L — ABNORMAL LOW (ref 135–145)
Total Bilirubin: 0.5 mg/dL (ref 0.3–1.2)
Total Protein: 7.4 g/dL (ref 6.5–8.1)

## 2019-01-06 LAB — GLUCOSE, CAPILLARY
Glucose-Capillary: 153 mg/dL — ABNORMAL HIGH (ref 70–99)
Glucose-Capillary: 171 mg/dL — ABNORMAL HIGH (ref 70–99)

## 2019-01-06 MED ORDER — ENTRESTO 24-26 MG PO TABS: 1.0000 | ORAL_TABLET | Freq: Two times a day (BID) | ORAL | Status: AC

## 2019-01-06 MED ORDER — ACETAMINOPHEN 325 MG PO TABS: 650.0000 mg | ORAL_TABLET | Freq: Four times a day (QID) | ORAL | Status: AC | PRN

## 2019-01-06 MED ORDER — BENZONATATE 100 MG PO CAPS: 100.0000 mg | ORAL_CAPSULE | Freq: Three times a day (TID) | ORAL | 0 refills | Status: AC | PRN

## 2019-01-06 MED ORDER — DEXAMETHASONE 6 MG PO TABS: 6.0000 mg | ORAL_TABLET | Freq: Every day | ORAL | 0 refills | Status: AC

## 2019-01-06 NOTE — Plan of Care (Signed)
Discharge instructions reviewed with patient, questions answered, verbalized understanding.  Awaiting ride.

## 2019-01-06 NOTE — Discharge Summary (Signed)
Physician Discharge Summary   Patient ID: Oscar Mack MRN: [id number] DOB/AGE: [birth year] 53 y.o.  Admit date: 01/02/2019 Discharge date: 01/06/2019  Primary Care Physician:  Patient, No Pcp Per   Recommendations for Outpatient Follow-up:  1. Follow up with PCP in 1-2 weeks 2. Patient was recommended to hold off on Entresto due to AKI on likely CKD, hyperkalemia until he follows up with his primary care physician or cardiologist in [facility]: None at baseline Equipment/Devices:   Discharge Condition: stable  CODE STATUS: FULL  Diet recommendation: Heart healthy diet   Discharge Diagnoses:    . Acute hypoxic respiratory failure due to acute COVID-19 viral pneumonitis . Acute kidney injury . Diabetes mellitus type 2, NIDDM . CAD (coronary artery disease) . Chronic systolic CHF (congestive heart failure) (HCC)   Consults: None    Allergies:  No Known Allergies   DISCHARGE MEDICATIONS: Allergies as of 01/06/2019   No Known Allergies     Medication List    TAKE these medications   acetaminophen 325 MG tablet Commonly known as: TYLENOL Take 2 tablets (650 mg total) by mouth every 6 (six) hours as needed for mild pain or headache (fever >/= 101).   aspirin EC 81 MG tablet Take 81 mg by mouth daily. Notes to patient: 01/07/2019   benzonatate 100 MG capsule Commonly known as: TESSALON Take 1 capsule (100 mg total) by mouth 3 (three) times daily as needed for cough. Notes to patient: 01/06/2019 two more doses    clopidogrel 75 MG tablet Commonly known as: PLAVIX Take 75 mg by mouth daily. Notes to patient: 01/07/2019    dexamethasone 6 MG tablet Commonly known as: DECADRON Take 1 tablet (6 mg total) by mouth daily for 5 days. Notes to patient: 01/06/2019    Entresto 24-26 MG Generic drug: sacubitril-valsartan Take 1 tablet by mouth 2 (two) times daily. HOLD until you see follow-up with your doctor What changed: additional  instructions Notes to patient: Hold    Farxiga 10 MG Tabs tablet Generic drug: dapagliflozin propanediol Take 10 mg by mouth daily. Notes to patient: 01/07/2019    metFORMIN 500 MG tablet Commonly known as: GLUCOPHAGE Take by mouth 2 (two) times daily with a meal. Notes to patient: 01/07/2019    metoprolol tartrate 25 MG tablet Commonly known as: LOPRESSOR Take 25 mg by mouth 2 (two) times daily. Notes to patient: 01/06/2019 evening dose    OZEMPIC (1 MG/DOSE) [place] Inject 1 mg into the skin once a week.        Brief H and P: For complete details please refer to admission H and P, but in brief  Patient is a 53 year old male visiting from [place], history of diabetes mellitus 2, CAD status post stentx 2, CHF (systolic), presented with dyspnea, tachycardia, nasal congestion, cough, diarrhea, intermittent headaches.  Fevers, 102 F, myalgias.  In ED, temp was 99.5 F.  Chest x-ray normal. Creatinine 2.28 procalcitonin less than 0.1  COVID-19 test positive   Hospital Course:   Acute hypoxic respiratory failure due to acute COVID-19 viral pneumonia during the ongoing 2020 COVID-19 pandemic- POA - Patient presented with fevers 102 F, myalgias, shortness of breath, coughing, diarrhea, intermittent headaches.  Chest x-ray showed no active disease -Given shortness of breath, acute COVID-19 viral pneumonitis, was placed on IV Decadron and remdesivir per pharmacy protocol -Patient was started on upportive care: vitamin C/zinc, albuterol, Tylenol.  - Oxygen - SpO2: 95 % -Inflammatory markers improving  Lab Results  Component  Value Date   SARSCOV2NAA POSITIVE (A) 01/02/2019     Recent Labs  Lab 01/02/19 0957 01/02/19 1237 01/03/19 0316 01/03/19 0847 01/04/19 0307 01/05/19 0357 01/06/19 0329  DDIMER  --  0.75*  --  0.50 0.64* 0.69*  --   FERRITIN  --  281  --  256 313 354*  --   CRP  --  3.7*  --  11.2* 6.9* 2.5*  --   ALT 26  --  26  --  PROCALCITON  --  <0.10  --    --   --   --   --     ARF (acute renal failure) (HCC) -Likely has underlying CKD, no baseline creatinine available in records.  Patient visiting from [place].  Reports that he does not remember being told about CKD. Sherryll Burger and Metformin were held. -Renal ultrasound negative for any obstruction or hydronephrosis -Possibly prerenal secondary to multiple episodes of diarrhea, dehydration and poor p.o. intake -Creatinine function plateaued at 1.7, likely has underlying CKD     CAD (coronary artery disease),  Chronic systolic and diastolic CHF (congestive heart failure) (HCC) -No chest pain, euvolemic -2D echo showed EF of 30 to 35% with moderate to severe decreased left ventricular function, increased LVH, grade 1 diastolic dysfunction -Continue aspirin, Plavix, beta-blocker  Diabetes mellitus type 2, NIDDM -Patient was recommended to continue diabetes medications for now and discuss with his PCP to adjust medications according to his renal function.  He is also interested in insulin.  -Patient was placed on sliding scale insulin while inpatient -Hemoglobin A1c 7.5   Day of Discharge S: Doing well, no fevers chills, nausea vomiting or diarrhea.  Sitting up in the chair.  On day #5 of remdesivir.  BP 109/77 (BP Location: Left Arm)   Pulse 63   Temp 98 F (36.7 C) (Oral)   Resp 16   Ht  (1.88 m)   Wt 96.8 kg   SpO2 95%   BMI 27.40 kg/m   Physical Exam: General: Alert and awake oriented x3 not in any acute distress. HEENT: anicteric sclera, pupils reactive to light and accommodation CVS: S1-S2 clear no murmur rubs or gallops Chest: clear to auscultation bilaterally, no wheezing rales or rhonchi Abdomen: soft nontender, nondistended, normal bowel sounds Extremities: no cyanosis, clubbing or edema noted bilaterally Neuro: Cranial nerves II-XII intact, no focal neurological deficits   The results of significant diagnostics from this hospitalization (including imaging,  microbiology, ancillary and laboratory) are listed below for reference.      Procedures/Studies:  NM Pulmonary Perfusion  Result Date: 01/03/2019 CLINICAL DATA:  Positive D-dimer EXAM: NUCLEAR MEDICINE PERFUSION LUNG SCAN TECHNIQUE: Perfusion images were obtained in multiple projections after intravenous injection of radiopharmaceutical. Ventilation scans intentionally deferred if perfusion scan and chest x-ray adequate for interpretation during COVID 19 epidemic. RADIOPHARMACEUTICALS:  1.47 mCi Tc-5m MAA IV COMPARISON:  Chest x-ray 02/01/2018 FINDINGS: No perfusion defects seen to suggest pulmonary embolus. IMPRESSION: No evidence of pulmonary embolus. Electronically Signed   By: Charlett Nose M.D.   On: 01/03/2019 11:50   US RENAL  Result Date: 01/03/2019 CLINICAL DATA:  Acute renal failure EXAM: RENAL / URINARY TRACT ULTRASOUND COMPLETE COMPARISON:  None. FINDINGS: Right Kidney: Renal measurements: 11.4 x 5.3 x 5.6 = volume: 175.2 mL . Echogenicity within normal limits. No mass or hydronephrosis visualized. Left Kidney: Renal measurements: 11.3 x 6.1 x 5.6 = volume: 200.8 mL. Echogenicity within normal limits. No mass or hydronephrosis visualized.  Bladder: Appears normal for degree of bladder distention. Other: Diffusely increased hepatic echogenicity with loss of definition of the portal triads and diminished posterior through transmission compatible with hepatic steatosis. Enlarged prostate is incidentally noted as well measuring 6.2 x 4.6 x 5.4 cm for a volume of 82 mL. IMPRESSION: Unremarkable renal ultrasound. Prostatomegaly. Diffusely increased hepatic echogenicity, most commonly seen with hepatic steatosis. Electronically Signed   By: Lovena Le M.D.   On: 01/03/2019 01:02   DG Chest Port 1 View  Result Date: 01/03/2019 CLINICAL DATA:  Dyspnea, tachycardia, nasal congestion, cough, diarrhea, and headache. COVID-19 positive earlier this month. EXAM: PORTABLE CHEST 1 VIEW COMPARISON:   01/02/2019 FINDINGS: The cardiac silhouette is borderline enlarged. No airspace consolidation, edema, pleural effusion, pneumothorax is identified. No acute osseous abnormality is seen. IMPRESSION: No active disease. Electronically Signed   By: Logan Bores M.D.   On: 01/03/2019 12:34   DG Chest Portable 1 View  Result Date: 01/02/2019 CLINICAL DATA:  Dyspnea, cough, headache EXAM: PORTABLE CHEST 1 VIEW COMPARISON:  None. FINDINGS: Normal heart size. Normal mediastinal contour. No pneumothorax. No pleural effusion. Lungs appear clear, with no acute consolidative airspace disease and no pulmonary edema. IMPRESSION: No active disease. Electronically Signed   By: Ilona Sorrel M.D.   On: 01/02/2019 09:12   ECHOCARDIOGRAM COMPLETE  Result Date: 01/03/2019   ECHOCARDIOGRAM REPORT   Patient Name:   Oscar Mack Date of Exam: 01/03/2019 Medical Rec #:  [id number]    Height:       74.0 in Accession #:    [id number]   Weight:       213.4 lb Date of Birth:  [birth year]    BSA:          2.23 m Patient Age:    61 years     BP:           138/86 mmHg Patient Gender: M            HR:           80 bpm. Exam Location:  Inpatient Procedure: 2D Echo, Color Doppler and Cardiac Doppler Indications:    R00.0 Tachycardia  History:        Patient has no prior history of Echocardiogram examinations.                 CHF, CAD; Risk Factors:Diabetes. COVID+ on 01/02/19.  Sonographer:    Raquel Sarna Senior RDCS Referring Phys: [place]  1. Left ventricular ejection fraction, by visual estimation, is 30 to 35%. The left ventricle has moderate to severely decreased function. There is moderately increased left ventricular hypertrophy.  2. Left ventricular diastolic parameters are consistent with Grade I diastolic dysfunction (impaired relaxation).  3. The left ventricle demonstrates global hypokinesis.  4. Global right ventricle has normal systolic function.The right ventricular size is normal.  5. Left atrial size was normal.   6. Right atrial size was normal.  7. Trivial pericardial effusion is present.  8. The mitral valve is normal in structure. No evidence of mitral valve regurgitation. No evidence of mitral stenosis.  9. The tricuspid valve is normal in structure. 10. The aortic valve is tricuspid. Aortic valve regurgitation is not visualized. No evidence of aortic valve sclerosis or stenosis. 11. The pulmonic valve was normal in structure. Pulmonic valve regurgitation is not visualized. 12. The inferior vena cava is normal in size with greater than 50% respiratory variability, suggesting right atrial pressure of 3 mmHg. 13. Moderate to  severe global reduction in LV systolic function; grade 1 diastolic dysfunction; moderate LVH. FINDINGS  Left Ventricle: Left ventricular ejection fraction, by visual estimation, is 30 to 35%. The left ventricle has moderate to severely decreased function. The left ventricle demonstrates global hypokinesis. There is moderately increased left ventricular hypertrophy. Left ventricular diastolic parameters are consistent with Grade I diastolic dysfunction (impaired relaxation). Normal left atrial pressure. Right Ventricle: The right ventricular size is normal.Global RV systolic function is has normal systolic function. Left Atrium: Left atrial size was normal in size. Right Atrium: Right atrial size was normal in size Pericardium: Trivial pericardial effusion is present. Mitral Valve: The mitral valve is normal in structure. No evidence of mitral valve regurgitation. No evidence of mitral valve stenosis by observation. Tricuspid Valve: The tricuspid valve is normal in structure. Tricuspid valve regurgitation is trivial. Aortic Valve: The aortic valve is tricuspid. Aortic valve regurgitation is not visualized. The aortic valve is structurally normal, with no evidence of sclerosis or stenosis. Pulmonic Valve: The pulmonic valve was normal in structure. Pulmonic valve regurgitation is not visualized. Pulmonic  regurgitation is not visualized. Aorta: The aortic root is normal in size and structure. Venous: The inferior vena cava is normal in size with greater than 50% respiratory variability, suggesting right atrial pressure of 3 mmHg.  Additional Comments: Moderate to severe global reduction in LV systolic function; grade 1 diastolic dysfunction; moderate LVH.  LEFT VENTRICLE PLAX 2D LVIDd:         4.00 cm  Diastology LVIDs:         2.60 cm  LV e' lateral:   6.96 cm/s LV PW:         1.20 cm  LV E/e' lateral: 6.0 LV IVS:        1.40 cm  LV e' medial:    5.44 cm/s LVOT diam:     2.30 cm  LV E/e' medial:  7.7 LV SV:         45 ml LV SV Index:   20.07 LVOT Area:     4.15 cm  RIGHT VENTRICLE RV S prime:     7.51 cm/s TAPSE (M-mode): 1.8 cm LEFT ATRIUM             Index       RIGHT ATRIUM           Index LA diam:        3.60 cm 1.61 cm/m  RA Area:     12.50 cm LA Vol (A2C):   41.0 ml 18.35 ml/m RA Volume:   26.10 ml  11.68 ml/m LA Vol (A4C):   37.1 ml 16.61 ml/m LA Biplane Vol: 42.4 ml 18.98 ml/m  AORTIC VALVE LVOT Vmax:   58.10 cm/s LVOT Vmean:  45.000 cm/s LVOT VTI:    0.113 m  AORTA Ao Root diam: 3.20 cm Ao Asc diam:  3.30 cm MITRAL VALVE MV Area (PHT): 1.84 cm             SHUNTS MV PHT:        119.48 msec          Systemic VTI:  0.11 m MV Decel Time: 412 msec             Systemic Diam: 2.30 cm MV E velocity: 42.00 cm/s 103 cm/s MV A velocity: 54.00 cm/s 70.3 cm/s MV E/A ratio:  0.78       1.5  Olga Millers MD Electronically signed by Olga Millers MD Signature Date/Time: 01/03/2019/2:34:36  PM    Final        LAB RESULTS: Basic Metabolic Panel: Recent Labs  Lab 01/05/19 0357 01/06/19 0329  NA 134* 133*  K 5.6* 5.0  CL 105 101  CO2 18* 20*  GLUCOSE 159* 202*  BUN 49* 50*  CREATININE 1.77* 1.74*  CALCIUM 9.0 9.3   Liver Function Tests: Recent Labs  Lab 01/05/19 0357 01/06/19 0329  AST 26 17  ALT 18 23  ALKPHOS 39 45  BILITOT 1.0 0.5  PROT 7.4 7.4  ALBUMIN 3.6 3.6   No results for  input(s): LIPASE, AMYLASE in the last 168 hours. No results for input(s): AMMONIA in the last 168 hours. CBC: Recent Labs  Lab 01/05/19 0357 01/06/19 0329  WBC 5.0 8.3  NEUTROABS 3.8 6.6  HGB 13.4 13.8  HCT 43.4 43.8  MCV 89.7 87.6  PLT 167 261   Cardiac Enzymes: No results for input(s): CKTOTAL, CKMB, CKMBINDEX, TROPONINI in the last 168 hours. BNP: Invalid input(s): POCBNP CBG: Recent Labs  Lab 01/06/19 0751 01/06/19 1126  GLUCAP 153* 171*      Disposition and Follow-up: Discharge Instructions    (HEART FAILURE PATIENTS) Call MD:  Anytime you have any of the following symptoms: 1) 3 pound weight gain in 24 hours or 5 pounds in 1 week 2) shortness of breath, with or without a dry hacking cough 3) swelling in the hands, feet or stomach 4) if you have to sleep on extra pillows at night in order to breathe.   Complete by: As directed    Diet Carb Modified   Complete by: As directed    Discharge instructions   Complete by: As directed    Please hold Entresto until you see your physician in [place].  Also discuss with your physician regarding your diabetes medications and possibly referral to nephrology given your chronic kidney disease.   Increase activity slowly   Complete by: As directed        DISPOSITION: Home   DISCHARGE FOLLOW-UP ; patient is visiting from [place] and will follow with his PCP there    Time coordinating discharge:  35-minutes  Signed:   Thad Rangeripudeep Jenniferann Stuckert M.D. [facility] 01/06/2019, 1:07 PM

## 2019-01-07 LAB — CULTURE, BLOOD (ROUTINE X 2)
Culture: NO GROWTH
Culture: NO GROWTH
Special Requests: ADEQUATE
Special Requests: ADEQUATE

## 2021-04-18 IMAGING — US US RENAL
1 series · 14 of 25 positions shown · non-contrast
Comparison: None.

CLINICAL DATA: Acute renal failure

EXAM:
RENAL / URINARY TRACT ULTRASOUND COMPLETE

[Series 1: us renal · 14 of 41 slices shown]
[im 1/41]
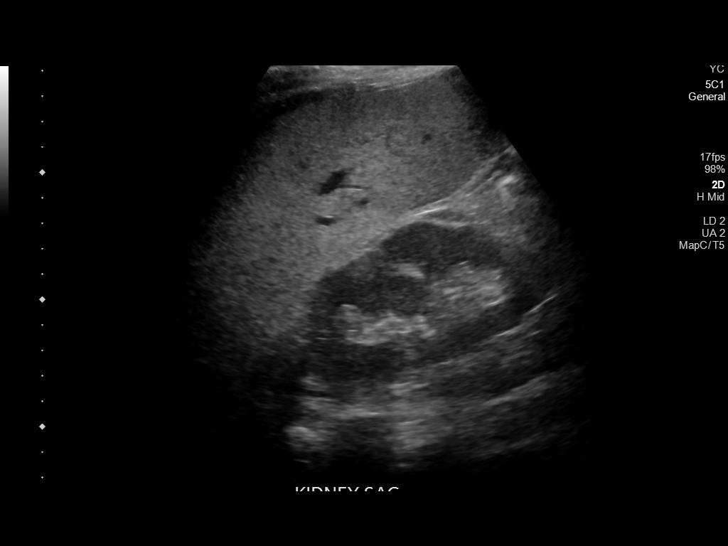
[im 4/41]
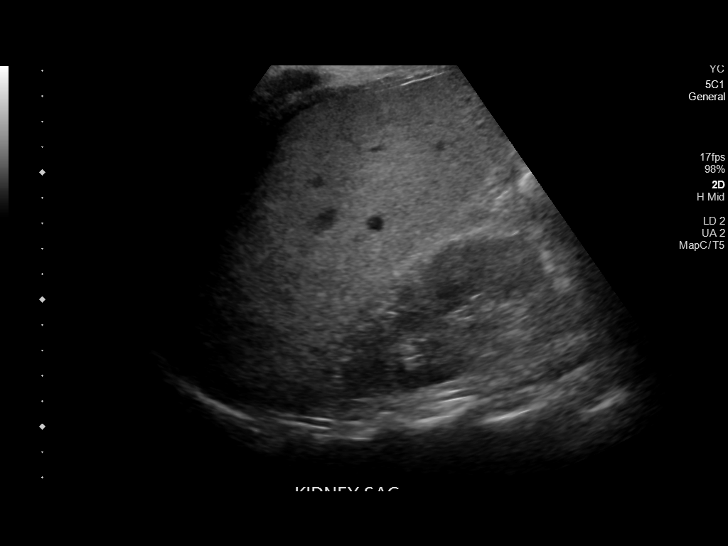
[im 7/41]
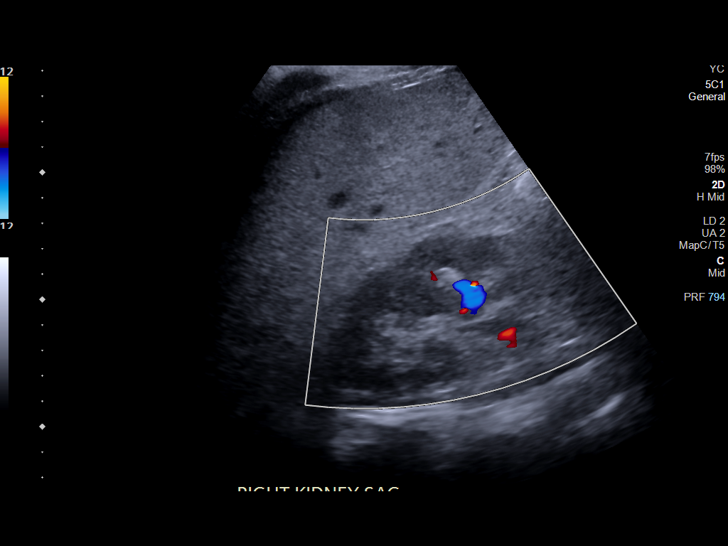
[im 11/41]
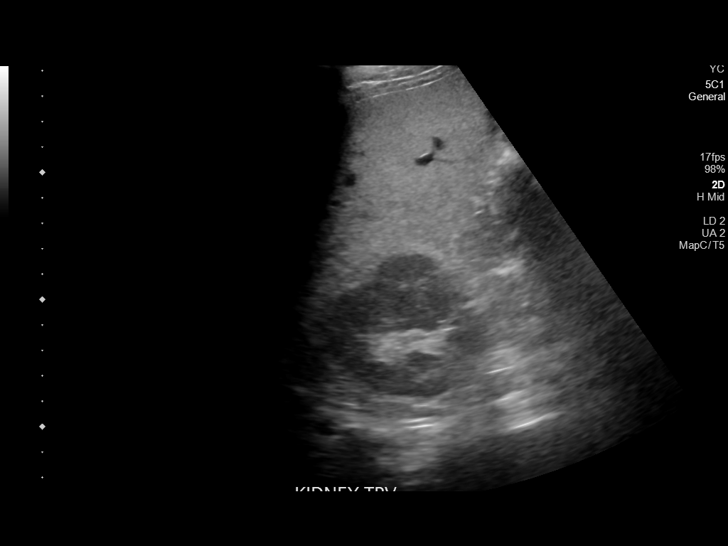
[im 14/41]
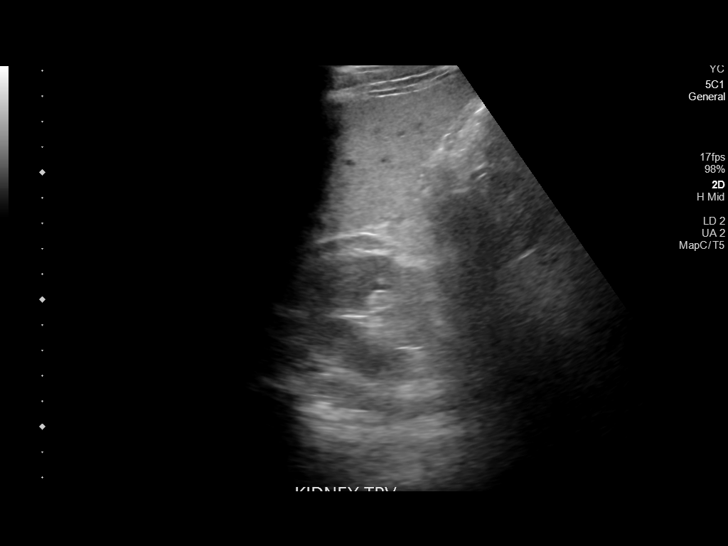
[im 16/41]
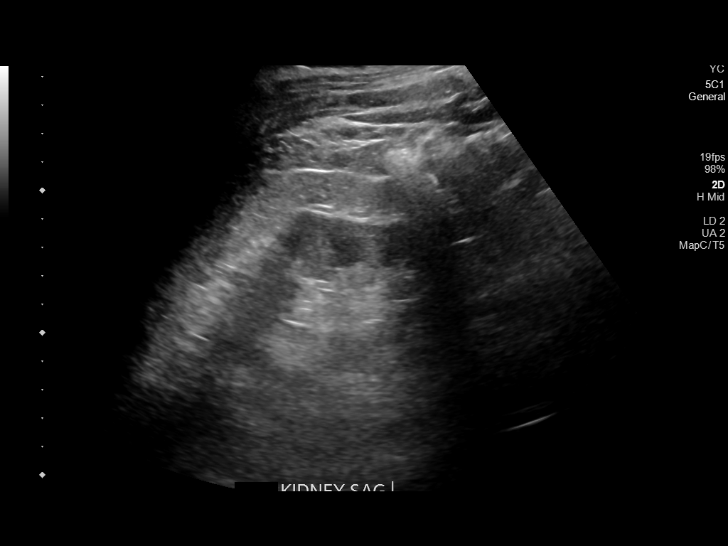
[im 19/41]
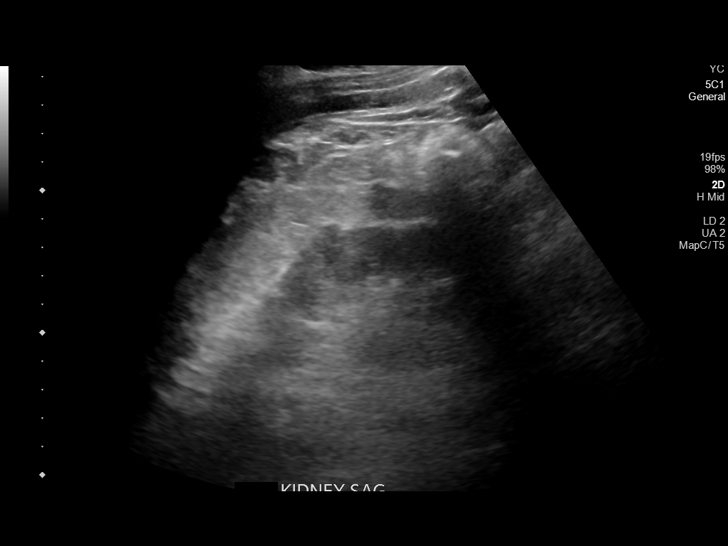
[im 22/41]
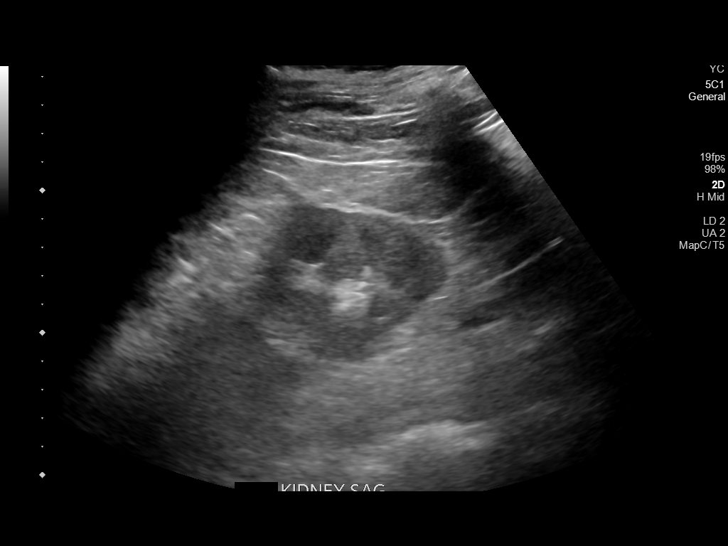
[im 26/41]
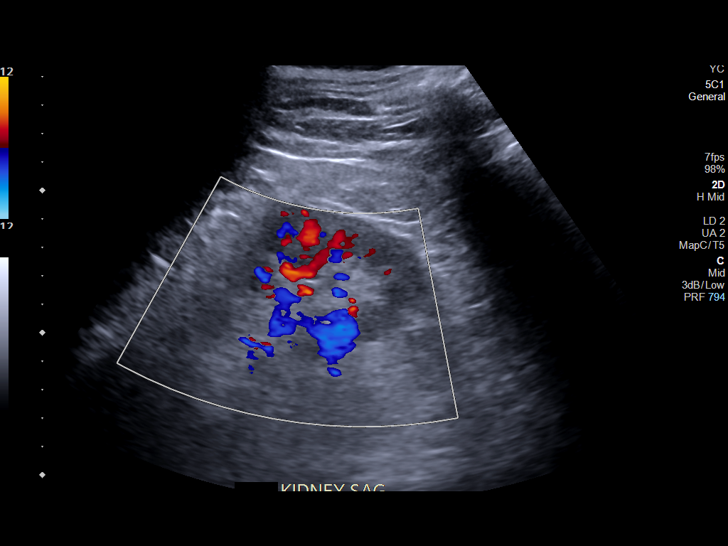
[im 27/41]
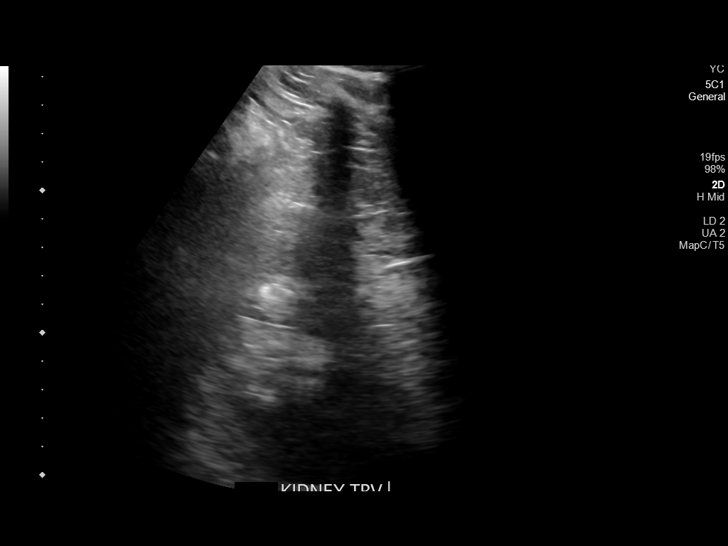
[im 31/41]
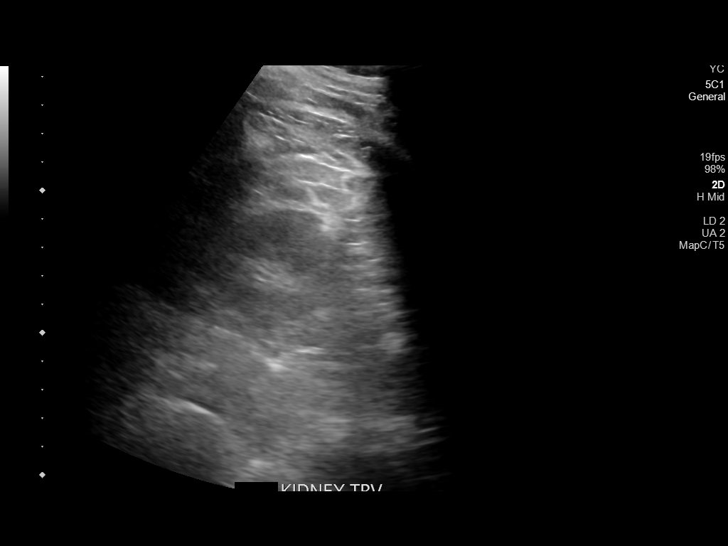
[im 34/41]
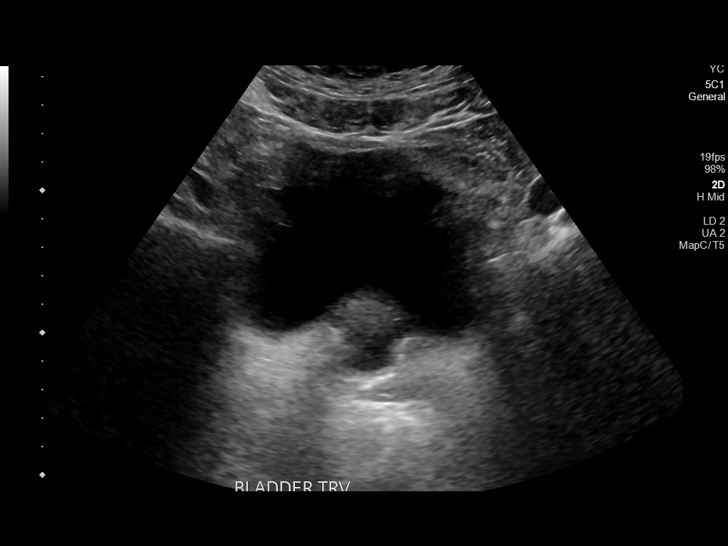
[im 37/41]
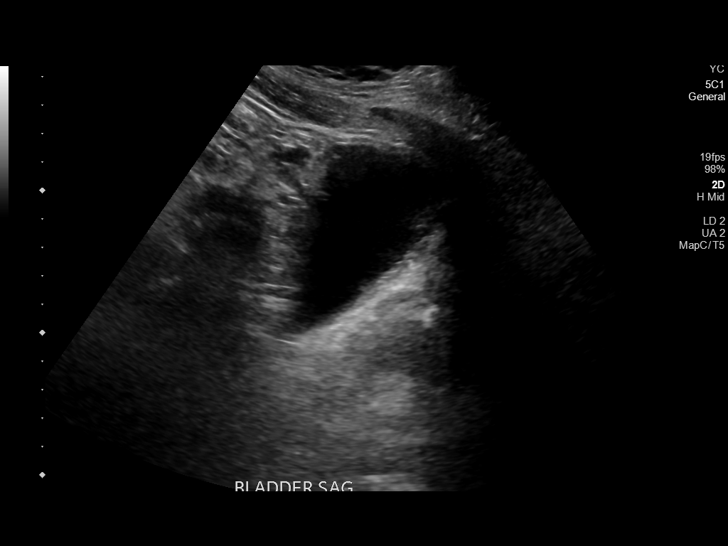
[im 41/41]
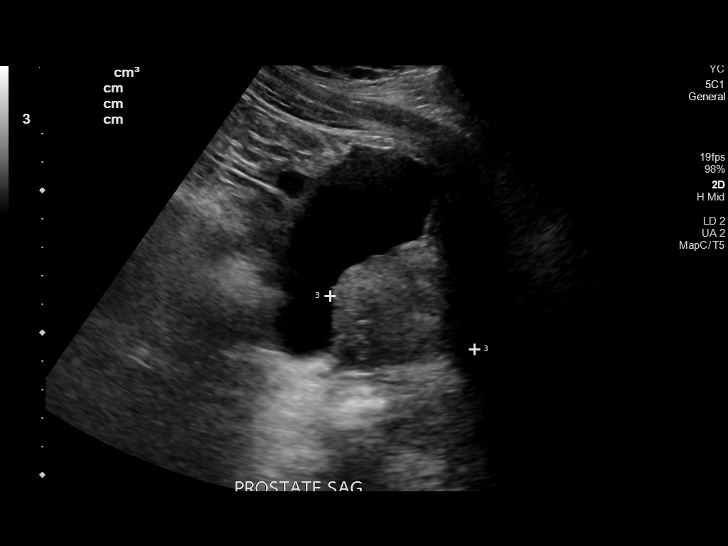

[14 of 25 positions shown; findings below may reference images not displayed]

FINDINGS: Right Kidney:

Renal measurements: 11.4 x 5.3 x 5.6 = volume: 175.2 mL .
Echogenicity within normal limits. No mass or hydronephrosis
visualized.

Left Kidney:

Renal measurements: 11.3 x 6.1 x 5.6 = volume: 200.8 mL.
Echogenicity within normal limits. No mass or hydronephrosis
visualized.

Bladder:

Appears normal for degree of bladder distention.

Other:

Diffusely increased hepatic echogenicity with loss of definition of
the portal triads and diminished posterior through transmission
compatible with hepatic steatosis. Enlarged prostate is incidentally
noted as well measuring 6.2 x 4.6 x 5.4 cm for a volume of 82 mL.
IMPRESSION: Unremarkable renal ultrasound.

Prostatomegaly.

Diffusely increased hepatic echogenicity, most commonly seen with
hepatic steatosis.
